# Patient Record
Sex: Male | Born: 1975 | Race: Black or African American | Hispanic: No | Smoking: Former smoker
Health system: Southern US, Community
[De-identification: ages and names within clinical notes are randomized; demographics above are authoritative.]

## PROBLEM LIST (undated history)

## (undated) DIAGNOSIS — L509 Urticaria, unspecified: Secondary | ICD-10-CM

## (undated) HISTORY — PX: HAND SURGERY: SHX662

## (undated) HISTORY — PX: APPENDECTOMY: SHX54

## (undated) HISTORY — DX: Urticaria, unspecified: L50.9

---

## 2002-10-29 ENCOUNTER — Emergency Department (HOSPITAL_COMMUNITY): Admission: EM | Admit: 2002-10-29 | Discharge: 2002-10-29 | Payer: Self-pay | Admitting: Emergency Medicine

## 2006-06-29 ENCOUNTER — Inpatient Hospital Stay (HOSPITAL_COMMUNITY): Admission: EM | Admit: 2006-06-29 | Discharge: 2006-06-30 | Payer: Self-pay | Admitting: Emergency Medicine

## 2006-06-29 ENCOUNTER — Encounter (INDEPENDENT_AMBULATORY_CARE_PROVIDER_SITE_OTHER): Payer: Self-pay | Admitting: Specialist

## 2009-01-18 ENCOUNTER — Ambulatory Visit: Admission: RE | Admit: 2009-01-18 | Discharge: 2009-01-18 | Payer: Self-pay | Admitting: Orthopedic Surgery

## 2010-10-02 LAB — CBC
HCT: 39.1 % (ref 39.0–52.0)
MCV: 100.8 fL — ABNORMAL HIGH (ref 78.0–100.0)
RBC: 3.88 MIL/uL — ABNORMAL LOW (ref 4.22–5.81)
WBC: 9.4 10*3/uL (ref 4.0–10.5)

## 2010-11-08 NOTE — Op Note (Signed)
NAME:  William Graves, William Graves NO.:  [id number]   MEDICAL RECORD NO.:  [id number]          PATIENT TYPE:  AMB   LOCATION:  DFTL                         FACILITY:  [facility]   PHYSICIAN:  Madelynn Done, MD  DATE OF BIRTH:  1976-01-25   DATE OF PROCEDURE:  01/18/2009  DATE OF DISCHARGE:                               OPERATIVE REPORT   PREOPERATIVE DIAGNOSIS:  Right small finger reverse Bennett metacarpal  base fracture with joint incongruity.   POSTOPERATIVE DIAGNOSIS:  Right small finger reverse Bennett metacarpal  base fracture with joint incongruity.   ATTENDING PHYSICIAN:  Madelynn Done, MD, who scrubbed and was  present for the entire procedure.   ASSISTANT SURGEON:  None.   SURGICAL PROCEDURES:  1. Closed manipulation and percutaneous skeletal fixation of unstable      metacarpal base fracture, reverse Bennett fracture with internal      fixation.  2. Radiographs three views, right hand.   SURGICAL IMPLANTS:  Two 0.045 K-wires.   SURGICAL INDICATIONS:  [name] is a 35 year old right-hand-dominant  gentleman, who punched stationary object last week.  The patient  presented to the office with an objective evidence of joint subluxation  of the fifth [facility] joint as well as the fracture of the base of the  metacarpal.  It was recommended that he undergo the above procedure.  Risks, benefits, and alternatives were discussed in detail with the  patient and a signed informed consent was obtained.  Risks include but  not limited to bleeding; infection; damage to nearby nerves, arteries,  or tendons; nonunion; malunion; hardware failure; loss of motion in  wrists and digits; and need for further surgical intervention.   DESCRIPTION OF PROCEDURE:  The patient was identified in preop holding  area, a mark with permanent marker was made on the right hand indicate  correct operative site.  The patient was then brought back to the  operating room, placed supine on  the anesthesia room table.  General  anesthesia was administered.  He received preoperative antibiotics.  A  well-padded tourniquet was then placed on the right brachium and sealed  with a 1000 drape.  Right upper extremity were prepped with Hibiclens  and then sterilely draped.  Time-out was called.  Correct side was  identified and procedure then begun.  Closed manipulation was then  carried out of the small finger at metacarpal base.  Radiographs using  mini C-arm confirmed reduction.  Following this, a 0.045 K-wire was then  placed in the metacarpal shaft on the small finger crossed into the  diastasis of the ring finger metacarpal with good purchase.  This held  the joint distracted or suspended.  Following this, another 0.045 K-wire  under direct mini C-arm visualization was then aided and guided from the  small finger metacarpal base into the hamate.  There is good purchase  into the bone.  Following placement of both K-wires, the joint was  assessed.  There was noted to be good alignment without any evidence of  clinical subluxation.  K-wires were then cut and bent on  left side of  the skin.  A Xeroform bolster dressing was then applied around the pin  sites.  Sterile compressive bandage were then applied.  The patient was  placed in a well molded ulnar gutter-type splint.  Tolerated procedure  well and returned to recovery room in good condition.   Intraoperative radiographs 3-views of the hand showed the internal  fixation in place with good congruity of the fifth CMC joint.   PLAN:  The patient is going to be discharged home and will be seen back  in the office in 10 days for pin check, x-rays, then placement of short-  arm cast for a total of 4 weeks immobilization.  Radiographs at each  visit.      Madelynn Done, MD  Electronically Signed     Madelynn Done, MD  Electronically Signed    FWO/MEDQ  D:  01/18/2009  T:  01/19/2009  Job:  [id number]

## 2010-11-11 NOTE — Op Note (Signed)
NAME:  William Graves, William Graves NO.:  [id number]   MEDICAL RECORD NO.:  [id number]          PATIENT TYPE:  INP   LOCATION:  2550                         FACILITY:  [facility]   PHYSICIAN:  Lebron Conners, M.D.   DATE OF BIRTH:  10-31-1975   DATE OF PROCEDURE:  06/29/2006  DATE OF DISCHARGE:                               OPERATIVE REPORT   PREOPERATIVE DIAGNOSIS:  Acute appendicitis.   POSTOPERATIVE DIAGNOSIS:  Acute appendicitis.   OPERATION:  Laparoscopic appendectomy.   SURGEON:  Lebron Conners, M.D.   ANESTHESIA:  General and local.   SPECIMEN:  Appendix.   BLOOD LOSS:  Minimal.   COMPLICATIONS:  None.   DISPOSITION:  The patient to PACU in good condition.   PROCEDURE:  After the patient was monitored and asleep and had the  bladder emptied with a catheter and had routine preparation and draping  of the abdomen, I infiltrated local anesthetic just below the umbilicus.  I made a 2 cm vertical incision and dissected down to the fascia and  made a 2 cm vertical incision in the midline and then bluntly entered  the peritoneal cavity.  I secured a Hassan cannula with 0 Vicryl  pursestring suture placing it with wide bites in the fascia.  I then  inflated the abdomen with carbon dioxide.  On first view, I could not  see any inflammation.  I placed a 5 mm right upper quadrant port and an  11 mm left lower quadrant port through anesthetized sites under direct  views noting that no viscera were injured with placement of the ports.  With the patient positioned head down and rotated to the left, I pulled  the cecum and small bowel away from the right lower quadrant and saw an  acutely inflamed appendix.  I saw no other abnormalities.  I bluntly  immobilized the appendix and grasped it with a large grasper and  elevated it.  The appendiceal mesentery was somewhat edematous and thick  and so I dissected that down just a bit with the cautery.  I then  stapled across the  mesentery and the appendix with three firings of the  endoscopic cutting stapler and then made a clean amputation of the  appendix, had good hemostasis, and no evidence of any vascular  compromise.  I irrigated the area briefly and removed a small amount of  blood which was present and a small amount of fluid which was seen.  I  checked for security of the staple lines and saw that they were secure  and that there was no continued bleeding.  I placed the appendix in a  plastic pouch and removed it through the umbilical incision and tied the  pursestring suture.  I removed the right upper quadrant  port under direct view and saw no bleeding from the abdominal wall.  After allowing carbon dioxide to escape, I removed the lower abdominal  port and then closed all skin incisions with intracuticular 4-0 Vicryl  and Steri-Strips and applied bandages.  He tolerated the operation well.      Lebron Conners, M.D.  Electronically Signed     WB/MEDQ  D:  06/29/2006  T:  06/29/2006  Job:  [id number]

## 2010-11-11 NOTE — H&P (Signed)
NAME:  William Graves, William Graves NO.:  [id number]   MEDICAL RECORD NO.:  [id number]          PATIENT TYPE:  EMS   LOCATION:  MAJO                         FACILITY:  [facility]   PHYSICIAN:  William Graves, M.D.   DATE OF BIRTH:  09-22-1975   DATE OF ADMISSION:  06/29/2006  DATE OF DISCHARGE:                              HISTORY & PHYSICAL   CHIEF COMPLAINT:  Right lower quadrant abdominal pain.   HISTORY OF PRESENT ILLNESS:  William Graves is a 35 year old male patient,  otherwise healthy, who developed right lower quadrant abdominal pain  yesterday evening.  The was the last time he ate something.  This pain  has been constant and associated with nausea and vomiting.  The pain  worsened.  His sisters convinced him he needed to present to the ER.  He  was found to have leukocytosis and a CT scan was positive for acute  appendicitis with an appendicolith.  Surgical evaluation has been  requested.   REVIEW OF SYSTEMS:  The patient's last BM was yesterday.  He has had no  fever, myalgias, or cough that he can recall.   PAST MEDICAL HISTORY:  None.   PAST SURGICAL HISTORY:  None.   FAMILY HISTORY:  Noncontributory.   SOCIAL HISTORY:  He does utilize tobacco products, 1 pack per day.  Social alcohol.  He is single and goes out 2 to 3 times per week with  friends.  He works at a call center in a desk job.   ALLERGIES:  1. ASPIRIN.  2. SULFA.   MEDICATIONS:  None.   PHYSICAL EXAMINATION:  GENERAL:  Pleasant male patient who currently  received multiple doses of Zofran and morphine for his symptoms related  to the appendicitis.  He is currently complaining of continued right  lower quadrant pain.  VITAL SIGNS:  Temp 97.2, BP 114/64, pulse 87 and regular, respirations  20.  NEURO:  Patient is alert and oriented x3, moving all extremities x4  without focal deficits.  HEENT:  Head normocephalic, sclerae noninjected.  NECK:  Supple, no adenopathy.  CHEST:  Bilateral lung  sounds clear to auscultation, respiratory effort  is nonlabored, he is on room air.  CARDIAC:  S1 S2, no rubs, murmurs, or gallops.  Pulse is regular.  ABDOMEN:  Soft, slightly distended.  Diminished bowel sounds.  He is  tender in the right lower quadrant with guarding and no rebounding.  Guarding is voluntary.  EXTREMITIES:  Symmetrical in appearance without edema, cyanosis, or  clubbing.   LABORATORY DATA:  An x-ray CT shows acute appendicitis with an  appendicolith.  White count 16,500, neutrophils 86%, hemoglobin 14.4,  platelets 182,000, sodium 138, potassium 3.7, CO2 24, glucose 121, BUN  10, creatinine 0.95.   IMPRESSION:  Acute appendicitis.   PLAN:  1. Admit the patient to the general surgical floor.  2. Agree with empiric Unasyn IV.  3. Plan a laparoscopic appendectomy this afternoon.  4. NPO status with IV fluids.  5. Dilaudid for pain and Zofran for nausea.  6. Any additional recommendations per Dr. Orson Slick after his examination  of the patient.      Allison L. Rennis Harding, N.P.      William Graves, M.D.  Electronically Signed    ALE/MEDQ  D:  06/29/2006  T:  06/29/2006  Job:  [id number]

## 2014-01-28 ENCOUNTER — Ambulatory Visit (INDEPENDENT_AMBULATORY_CARE_PROVIDER_SITE_OTHER): Payer: BC Managed Care – PPO | Admitting: Emergency Medicine

## 2014-01-28 ENCOUNTER — Ambulatory Visit (INDEPENDENT_AMBULATORY_CARE_PROVIDER_SITE_OTHER): Payer: BC Managed Care – PPO

## 2014-01-28 VITALS — BP 106/80 | HR 67 | Temp 98.2°F | Resp 16 | Ht 73.5 in | Wt 221.8 lb

## 2014-01-28 DIAGNOSIS — M545 Low back pain, unspecified: Secondary | ICD-10-CM

## 2014-01-28 MED ORDER — CYCLOBENZAPRINE HCL 10 MG PO TABS: ORAL_TABLET | ORAL | Status: DC

## 2014-01-28 NOTE — Patient Instructions (Signed)
Back Pain, Adult Low back pain is very common. About 1 in 5 people have back pain.The cause of low back pain is rarely dangerous. The pain often gets better over time.About half of people with a sudden onset of back pain feel better in just 2 weeks. About 8 in 10 people feel better by 6 weeks.  CAUSES Some common causes of back pain include:  Strain of the muscles or ligaments supporting the spine.  Wear and tear (degeneration) of the spinal discs.  Arthritis.  Direct injury to the back. DIAGNOSIS Most of the time, the direct cause of low back pain is not known.However, back pain can be treated effectively even when the exact cause of the pain is unknown.Answering your caregiver's questions about your overall health and symptoms is one of the most accurate ways to make sure the cause of your pain is not dangerous. If your caregiver needs more information, he or she may order lab work or imaging tests (X-rays or MRIs).However, even if imaging tests show changes in your back, this usually does not require surgery. HOME CARE INSTRUCTIONS For many people, back pain returns.Since low back pain is rarely dangerous, it is often a condition that people can learn to manageon their own.   Remain active. It is stressful on the back to sit or stand in one place. Do not sit, drive, or stand in one place for more than 30 minutes at a time. Take short walks on level surfaces as soon as pain allows.Try to increase the length of time you walk each day.  Do not stay in bed.Resting more than 1 or 2 days can delay your recovery.  Do not avoid exercise or work.Your body is made to move.It is not dangerous to be active, even though your back may hurt.Your back will likely heal faster if you return to being active before your pain is gone.  Pay attention to your body when you bend and lift. Many people have less discomfortwhen lifting if they bend their knees, keep the load close to their bodies,and  avoid twisting. Often, the most comfortable positions are those that put less stress on your recovering back.  Find a comfortable position to sleep. Use a firm mattress and lie on your side with your knees slightly bent. If you lie on your back, put a pillow under your knees.  Only take over-the-counter or prescription medicines as directed by your caregiver. Over-the-counter medicines to reduce pain and inflammation are often the most helpful.Your caregiver may prescribe muscle relaxant drugs.These medicines help dull your pain so you can more quickly return to your normal activities and healthy exercise.  Put ice on the injured area.  Put ice in a plastic bag.  Place a towel between your skin and the bag.  Leave the ice on for 15-20 minutes, 03-04 times a day for the first 2 to 3 days. After that, ice and heat may be alternated to reduce pain and spasms.  Ask your caregiver about trying back exercises and gentle massage. This may be of some benefit.  Avoid feeling anxious or stressed.Stress increases muscle tension and can worsen back pain.It is important to recognize when you are anxious or stressed and learn ways to manage it.Exercise is a great option. SEEK MEDICAL CARE IF:  You have pain that is not relieved with rest or medicine.  You have pain that does not improve in 1 week.  You have new symptoms.  You are generally not feeling well. SEEK   IMMEDIATE MEDICAL CARE IF:   You have pain that radiates from your back into your legs.  You develop new bowel or bladder control problems.  You have unusual weakness or numbness in your arms or legs.  You develop nausea or vomiting.  You develop abdominal pain.  You feel faint. Document Released: 06/12/2005 Document Revised: 12/12/2011 Document Reviewed: 10/14/2013 Exit[employer] Patient Information 2015 [employer]. This information is not intended to replace advice given to you by your health care provider. Make sure you  discuss any questions you have with your health care provider.  

## 2014-01-28 NOTE — Progress Notes (Signed)
   Subjective:  This chart was scribed for William ChrisSteven Markanthony Gedney, MD by Marica OtterNusrat Rahman, ED Scribe. This patient was seen in room 13 and the patient's care was started at 2:35 PM.      Patient ID: William Graves, male    DOB: 12/07/1975, 38 y.o.   MRN: [id number]  HPI HPI Comments: William Graves is a 38 y.o. male who presents to the [facility] complaining of non-radiating, worsening, intermittent middle lower back pain onset over the weekend and worsening starting Monday. Pt reports the pain was so bad today he could not work. Pt describes his pain as a tightness sensation and reports that the pain is terrible when he gets up from a sitting position. Further, pt reports the pain is aggravated with twisting and movement. Pt denies dysuria, leg pain.   Pt reports that his job requires him to do quite a bit of lifting boxes. 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    Review of Systems  Constitutional: Negative for fatigue and unexpected weight change.  Eyes: Negative for visual disturbance.  Respiratory: Negative for cough, chest tightness and shortness of breath.   Cardiovascular: Negative for chest pain, palpitations and leg swelling.  Gastrointestinal: Negative for abdominal pain and blood in stool.  Musculoskeletal: Positive for back pain (middle lower back pain).  Neurological: Negative for dizziness, light-headedness and headaches.       Objective:   Physical Exam  CONSTITUTIONAL: Well developed/well nourished HEAD: Normocephalic/atraumatic EYES: EOMI/PERRL ENMT: Mucous membranes moist NECK: supple no meningeal signs SPINE:entire spine nontender CV: S1/S2 noted, no murmurs/rubs/gallops noted LUNGS: Lungs are clear to auscultation bilaterally, no apparent distress ABDOMEN: soft, nontender, no rebound or guarding GU:no cva tenderness NEURO: Pt is awake/alert, moves all extremitiesx4 EXTREMITIES: pulses normal,  full ROM SKIN: warm, color normal PSYCH: no abnormalities of mood noted BACK: Tenderness over L4 and L5, reflexes and strength are normal.  [facility] reading (PRIMARY) by  Dr.Thoms Barthelemy there is mild narrowing at L5-S1 otherwise unremarkable      Assessment & Plan:  DIAGNOSTIC STUDIES: Oxygen Saturation is 98% on RA, nl by my interpretation.    COORDINATION OF CARE: 2:38 PM-Discussed treatment plan which includes imaging with pt at bedside and pt agreed to plan  Muscle relaxant at night he will use ice to his back and given instructions regarding stretching.. patient does not know what his aspirin allergy is. He states he has taken Aleve before without difficulties so he will take this medication.

## 2014-01-30 ENCOUNTER — Telehealth: Payer: Self-pay

## 2014-01-30 NOTE — Telephone Encounter (Signed)
Awaiting forms

## 2014-01-30 NOTE — Telephone Encounter (Signed)
Pt fmla ppw dropped off in Dr. Ellis Parentsaub's box for completion in 5-7 business days

## 2014-01-30 NOTE — Telephone Encounter (Signed)
Called patient (per Dr. Cleta Albertsaub request) to clarify why exactly he needs [name] if he was only seen one time by us for this issue and he is to return to work on Monday 02/02/14. Awaiting patient return call

## 2019-05-04 ENCOUNTER — Other Ambulatory Visit: Payer: Self-pay

## 2019-05-04 ENCOUNTER — Emergency Department (HOSPITAL_COMMUNITY): Payer: 59

## 2019-05-04 ENCOUNTER — Emergency Department (HOSPITAL_COMMUNITY)
Admission: EM | Admit: 2019-05-04 | Discharge: 2019-05-04 | Disposition: A | Discharge status: Home / Self Care | Payer: 59 | Attending: Emergency Medicine | Admitting: Emergency Medicine

## 2019-05-04 ENCOUNTER — Encounter (HOSPITAL_COMMUNITY): Payer: Self-pay | Admitting: Emergency Medicine

## 2019-05-04 DIAGNOSIS — L509 Urticaria, unspecified: Secondary | ICD-10-CM | POA: Diagnosis present

## 2019-05-04 DIAGNOSIS — Y929 Unspecified place or not applicable: Secondary | ICD-10-CM | POA: Insufficient documentation

## 2019-05-04 DIAGNOSIS — S0083XA Contusion of other part of head, initial encounter: Secondary | ICD-10-CM

## 2019-05-04 DIAGNOSIS — T782XXA Anaphylactic shock, unspecified, initial encounter: Secondary | ICD-10-CM | POA: Diagnosis not present

## 2019-05-04 DIAGNOSIS — R0789 Other chest pain: Secondary | ICD-10-CM

## 2019-05-04 DIAGNOSIS — R55 Syncope and collapse: Secondary | ICD-10-CM | POA: Insufficient documentation

## 2019-05-04 DIAGNOSIS — R0781 Pleurodynia: Secondary | ICD-10-CM | POA: Insufficient documentation

## 2019-05-04 DIAGNOSIS — Y999 Unspecified external cause status: Secondary | ICD-10-CM | POA: Insufficient documentation

## 2019-05-04 DIAGNOSIS — Y939 Activity, unspecified: Secondary | ICD-10-CM | POA: Diagnosis not present

## 2019-05-04 DIAGNOSIS — F17210 Nicotine dependence, cigarettes, uncomplicated: Secondary | ICD-10-CM | POA: Insufficient documentation

## 2019-05-04 DIAGNOSIS — W010XXA Fall on same level from slipping, tripping and stumbling without subsequent striking against object, initial encounter: Secondary | ICD-10-CM | POA: Insufficient documentation

## 2019-05-04 DIAGNOSIS — Z79899 Other long term (current) drug therapy: Secondary | ICD-10-CM | POA: Diagnosis not present

## 2019-05-04 MED ORDER — EPINEPHRINE 0.3 MG/0.3ML IJ SOAJ: 0.3000 mg | Freq: Once | INTRAMUSCULAR | 0 refills | Status: AC | PRN

## 2019-05-04 MED ORDER — CETIRIZINE HCL 10 MG PO TABS: 10.0000 mg | ORAL_TABLET | Freq: Two times a day (BID) | ORAL | 1 refills | Status: DC

## 2019-05-04 MED ORDER — PREDNISONE 20 MG PO TABS: 40.0000 mg | ORAL_TABLET | Freq: Every day | ORAL | 0 refills | Status: DC

## 2019-05-04 MED ORDER — FAMOTIDINE 20 MG PO TABS: 20.0000 mg | ORAL_TABLET | Freq: Two times a day (BID) | ORAL | 0 refills | Status: DC

## 2019-05-04 MED ORDER — METHYLPREDNISOLONE SODIUM SUCC 125 MG IJ SOLR
125.0000 mg | Freq: Once | INTRAMUSCULAR | Status: AC
  Administered 2019-05-04: 03:00:00 125 mg via INTRAVENOUS
  Filled 2019-05-04: qty 2

## 2019-05-04 MED ORDER — FAMOTIDINE IN NACL 20-0.9 MG/50ML-% IV SOLN
20.0000 mg | Freq: Once | INTRAVENOUS | Status: AC
  Administered 2019-05-04: 20 mg via INTRAVENOUS
  Filled 2019-05-04: qty 50

## 2019-05-04 NOTE — ED Triage Notes (Signed)
Pt. BIB [facility] from home c/o allergic reactions to fish followed by syncopal episode.   Pt. Ate fish at 8 pm. William Graves to sleep and woke up at 10 pm with allergy symptome, rash. Pt. Took 50 diphenhydramine PO at home and went back to sleep. Pt. Awoke again feeling worse at 1:30 am. Pt. Had syncopal episode at this time. Pt presents with hematoma to the right eye and pain on left ribs. No crepitous noted. Pt. Side is red and irritated.   EMS Initial BP 80/58 bolus given.   Medication given by EMS  0.3 epi 50 diphenylamine  4 Zofran  500 cc fluid  EMS VS 132/88  84 NSR 18 RR 97% RA

## 2019-05-04 NOTE — ED Notes (Signed)
Patient transported to X-ray 

## 2019-05-04 NOTE — Discharge Instructions (Addendum)
1. Medications: Prednisone, Zyrtec, Pepcid, EpiPen, usual home medications 2. Treatment: rest, drink plenty of fluids, take medications as prescribed 3. Follow Up: Please followup with your primary doctor in 3 days for discussion of your diagnoses and further evaluation after today's visit; if you do not have a primary care doctor use the resource guide provided to find one; followup with dermatology as needed; Return to the ER for difficulty breathing, return of allergic reaction or other concerning symptoms  

## 2019-05-04 NOTE — ED Provider Notes (Signed)
[facility] EMERGENCY DEPARTMENT Provider Note   CSN: [id number] Arrival date & time: 05/04/19  0240     History   Chief Complaint Chief Complaint  Patient presents with   Allergic Reaction   Loss of Consciousness    HPI William Graves is a 43 y.o. male with no major medical problems presents to the Emergency Department complaining of gradual, persistent, progressively worsening allergic reaction onset around 10 PM.  Patient reports he ate dinner around 8 PM which was fried fish.  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  He reports waking from sleep around 10 PM with hives over his entire body.  He got up to take Benadryl and had a witnessed syncopal episode striking his left ribs and right side of his face.  Patient took Benadryl and went back to bed.  He awoke approximately 1 hour prior to arrival with significant worsening of symptoms and EMS was contacted.  They found patient hypotensive with systolic blood pressure in the 80s, full body hives and nausea.  Patient was given 0.3 mg of epi, 50 mg of Benadryl IV and Zofran.  No wheezing or respiratory distress was noted with EMS.  Patient denies history of hospitalization, intubation or need for EpiPen and previous allergic reactions.  No other associated symptoms.  Patient reports he is beginning to feel some better after the medications.    The history is provided by the patient and medical records. No language interpreter was used.    History reviewed. No pertinent past medical history.  There are no active problems to display for this patient.   Past Surgical History:  Procedure Laterality Date   APPENDECTOMY     HAND SURGERY          Home Medications    Prior to Admission medications   Medication Sig Start Date End Date Taking? Authorizing Provider  cetirizine (ZYRTEC ALLERGY) 10 MG tablet Take 1 tablet (10 mg total) by mouth 2 (two)  times daily. 05/04/19   Yarnell Kozloski, Dahlia Client, PA-C  cyclobenzaprine (FLEXERIL) 10 MG tablet Take one tablet at night as a muscle relaxant 01/28/14   Collene Gobble, MD  EPINEPHrine (EPIPEN 2-PAK) 0.3 mg/0.3 mL IJ SOAJ injection Inject 0.3 mLs (0.3 mg total) into the muscle once as needed (for severe allergic reaction). CAll 911 immediately if you have to use this medicine 05/04/19   Rorey Bisson, Dahlia Client, PA-C  famotidine (PEPCID) 20 MG tablet Take 1 tablet (20 mg total) by mouth 2 (two) times daily for 5 days. 05/04/19 05/09/19  Ivah Girardot, Dahlia Client, PA-C  ibuprofen (ADVIL,MOTRIN) 200 MG tablet Take 400 mg by mouth every 6 (six) hours as needed.    [provider]  predniSONE (DELTASONE) 20 MG tablet Take 2 tablets (40 mg total) by mouth daily. 05/04/19   Chelsea Nusz, Boyd Kerbs    Family History History reviewed. No pertinent family history.  Social History Social History   Tobacco Use   Smoking status: Current Every Day Smoker   Smokeless tobacco: Former Engineer, water Use Topics   Alcohol use: No   Drug use: No     Allergies   Aspirin and Sulfa antibiotics   Review of Systems Review of Systems  Constitutional: Negative for appetite change, diaphoresis, fatigue, fever and unexpected weight change.  HENT: Positive for facial swelling. Negative for mouth sores.   Eyes: Negative for visual disturbance.  Respiratory: Negative for cough, chest tightness, shortness  of breath and wheezing.   Cardiovascular: Positive for syncope. Negative for chest pain.  Gastrointestinal: Positive for nausea. Negative for abdominal pain, constipation, diarrhea and vomiting.  Endocrine: Negative for polydipsia, polyphagia and polyuria.  Genitourinary: Negative for dysuria, frequency, hematuria and urgency.  Musculoskeletal: Negative for back pain and neck stiffness.  Skin: Positive for rash.  Allergic/Immunologic: Negative for immunocompromised state.  Neurological: Positive for syncope.  Negative for light-headedness and headaches.  Hematological: Does not bruise/bleed easily.  Psychiatric/Behavioral: Negative for sleep disturbance. The patient is not nervous/anxious.      Physical Exam Updated Vital Signs BP 122/86 (BP Location: Right Arm)    Pulse 87    Temp (!) 97.3 F (36.3 C) (Oral)    Resp 17    Ht 6\' 2"  (1.88 m)    Wt 102.1 kg    SpO2 99%    BMI 28.89 kg/m   Physical Exam Vitals signs and nursing note reviewed.  Constitutional:      General: He is not in acute distress.    Appearance: He is well-developed. He is not diaphoretic.  HENT:     Head: Normocephalic.      Comments: No Malocclusion No battle signs No [place] eyes No hemotympanum bilaterally     Right Ear: Tympanic membrane, ear canal and external ear normal.     Left Ear: Tympanic membrane, ear canal and external ear normal.     Nose: Nose normal. No mucosal edema or rhinorrhea.     Mouth/Throat:     Pharynx: Uvula midline. No oropharyngeal exudate, posterior oropharyngeal erythema or uvula swelling.     Tonsils: No tonsillar abscesses.  Eyes:     Extraocular Movements: Extraocular movements intact.     Conjunctiva/sclera: Conjunctivae normal.     Pupils: Pupils are equal, round, and reactive to light.  Neck:     Musculoskeletal: Normal range of motion. No pain with movement, spinous process tenderness or muscular tenderness.     Comments: Patent airway No stridor; normal phonation Handling secretions without difficulty Cardiovascular:     Rate and Rhythm: Normal rate.     Heart sounds: Normal heart sounds. No murmur.  Pulmonary:     Effort: Pulmonary effort is normal. No respiratory distress.     Breath sounds: Normal breath sounds. No stridor. No wheezing.  Abdominal:     General: Bowel sounds are normal.     Palpations: Abdomen is soft.     Tenderness: There is no abdominal tenderness.  Musculoskeletal: Normal range of motion.  Skin:    General: Skin is warm and dry.      Findings: Rash present.     Comments: Widespread urticaria noted Mild excoriations - no induration or fluctuance to indicate secondary infection  Neurological:     Mental Status: He is alert and oriented to person, place, and time.      ED Treatments / Results   Radiology Dg Ribs Unilateral W/chest Left  Result Date: 05/04/2019 CLINICAL DATA:  Syncope with fall and left rib pain. EXAM: LEFT RIBS AND CHEST - 3+ VIEW COMPARISON:  None. FINDINGS: Lungs are adequately inflated and otherwise clear. Cardiomediastinal silhouette is normal. No evidence of left rib fracture. IMPRESSION: No acute findings. Electronically Signed   By: Marin Olp M.D.   On: 05/04/2019 05:22   Ct Head Wo Contrast  Result Date: 05/04/2019 CLINICAL DATA:  Syncopal episode with trauma to right eye. EXAM: CT HEAD WITHOUT CONTRAST CT MAXILLOFACIAL WITHOUT CONTRAST TECHNIQUE: Multidetector CT imaging of  the head and maxillofacial structures were performed using the standard protocol without intravenous contrast. Multiplanar CT image reconstructions of the maxillofacial structures were also generated. COMPARISON:  None. FINDINGS: CT HEAD FINDINGS Brain: No evidence of acute infarction, hemorrhage, hydrocephalus, extra-axial collection or mass lesion/mass effect. Vascular: No hyperdense vessel or unexpected calcification. Skull: Normal. Negative for fracture or focal lesion. Other: None. CT MAXILLOFACIAL FINDINGS Osseous: No acute facial bone fracture. Orbits: Normal. Sinuses: Paranasal sinuses are well developed and well aerated without air-fluid levels. Subtle opacification over the ethmoid air cells and frontoethmoidal recess. Ostiomeatal complexes are patent. Subtle deviation of the nasal septum to the right. Mastoid air cells are clear. Soft tissues: Normal. IMPRESSION: 1.  Normal head CT. 2.  No acute facial bone fracture. Electronically Signed   By: Elberta Fortisaniel  Boyle M.D.   On: 05/04/2019 05:30   Ct Maxillofacial Wo  Contrast  Result Date: 05/04/2019 CLINICAL DATA:  Syncopal episode with trauma to right eye. EXAM: CT HEAD WITHOUT CONTRAST CT MAXILLOFACIAL WITHOUT CONTRAST TECHNIQUE: Multidetector CT imaging of the head and maxillofacial structures were performed using the standard protocol without intravenous contrast. Multiplanar CT image reconstructions of the maxillofacial structures were also generated. COMPARISON:  None. FINDINGS: CT HEAD FINDINGS Brain: No evidence of acute infarction, hemorrhage, hydrocephalus, extra-axial collection or mass lesion/mass effect. Vascular: No hyperdense vessel or unexpected calcification. Skull: Normal. Negative for fracture or focal lesion. Other: None. CT MAXILLOFACIAL FINDINGS Osseous: No acute facial bone fracture. Orbits: Normal. Sinuses: Paranasal sinuses are well developed and well aerated without air-fluid levels. Subtle opacification over the ethmoid air cells and frontoethmoidal recess. Ostiomeatal complexes are patent. Subtle deviation of the nasal septum to the right. Mastoid air cells are clear. Soft tissues: Normal. IMPRESSION: 1.  Normal head CT. 2.  No acute facial bone fracture. Electronically Signed   By: Elberta Fortisaniel  Boyle M.D.   On: 05/04/2019 05:30    Procedures .Critical Care Performed by: Dierdre ForthMuthersbaugh, Haidar Muse, PA-C Authorized by: Dierdre ForthMuthersbaugh, Aidon Klemens, PA-C   Critical care provider statement:    Critical care time (minutes):  45   Critical care time was exclusive of:  Separately billable procedures and treating other patients and teaching time   Critical care was necessary to treat or prevent imminent or life-threatening deterioration of the following conditions:  Shock   Critical care was time spent personally by me on the following activities:  Discussions with consultants, evaluation of patient's response to treatment, examination of patient, ordering and performing treatments and interventions, ordering and review of laboratory studies, ordering and review  of radiographic studies, pulse oximetry, re-evaluation of patient's condition, obtaining history from patient or surrogate and review of old charts   I assumed direction of critical care for this patient from another provider in my specialty: no     (including critical care time)  Medications Ordered in ED Medications  methylPREDNISolone sodium succinate (SOLU-MEDROL) 125 mg/2 mL injection 125 mg (125 mg Intravenous Given 05/04/19 0319)  famotidine (PEPCID) IVPB 20 mg premix (0 mg Intravenous Stopped 05/04/19 0401)     Initial Impression / Assessment and Plan / ED Course  I have reviewed the triage vital signs and the nursing notes.  Pertinent labs & imaging results that were available during my care of the patient were reviewed by me and considered in my medical decision making (see chart for details).  Clinical Course as of May 03 653  Wynelle LinkSun May 04, 2019  0315 Rash improving.  No hypotension   [HM]  0355 Improving rash,  pt without respiratory distress nausea or vomiting.    [HM]  0456 Pt feeling better. Rash continues to improve.  No SOB, wheezing, abd pain, N/V.   [HM]  0600 Patient continues to feel well.  No return of symptoms.   [HM]    Clinical Course User Index [HM] Tradarius Reinwald, Dahlia Client, PA-C       Patient presents with anaphylaxis from fish.  Hypotensive on EMS arrival after syncopal episode and received epi and Benadryl IV.  Given Solu-Medrol and Pepcid here in the emergency department.  Observed for 4 hours.  No recurrence of symptoms requiring additional epinephrine.  Patient is well-appearing without respiratory distress, nausea or vomiting.  Additionally, patient with facial contusion and rib pain secondary to fall from syncopal episode.  CT scan of the face without orbital fracture.  No cervical pain.  X-rays without evidence of rib fractures.  Patient re-evaluated prior to dc, is hemodynamically stable, in no respiratory distress, and denies the feeling of throat  closing. Pt has been advised to return to the ED if they have a mod-severe allergic rxn (s/s including throat closing, difficulty breathing, swelling of lips face or tongue). Pt is to follow up with their Asthma/allergy. Pt is agreeable with plan & verbalizes understanding.   Final Clinical Impressions(s) / ED Diagnoses   Final diagnoses:  Anaphylaxis, initial encounter  Contusion of face, initial encounter  Rib pain    ED Discharge Orders         Ordered    cetirizine (ZYRTEC ALLERGY) 10 MG tablet  2 times daily     05/04/19 0648    famotidine (PEPCID) 20 MG tablet  2 times daily     05/04/19 0648    predniSONE (DELTASONE) 20 MG tablet  Daily     05/04/19 0648    EPINEPHrine (EPIPEN 2-PAK) 0.3 mg/0.3 mL IJ SOAJ injection  Once PRN     05/04/19 0648           Nekoda Chock, Dahlia Client, PA-C 05/04/19 0654    Cardama, Amadeo Garnet, MD 05/04/19 [phone]

## 2019-05-13 ENCOUNTER — Other Ambulatory Visit: Payer: Self-pay

## 2019-05-13 ENCOUNTER — Encounter: Payer: Self-pay | Admitting: Allergy and Immunology

## 2019-05-13 ENCOUNTER — Ambulatory Visit (INDEPENDENT_AMBULATORY_CARE_PROVIDER_SITE_OTHER): Payer: 59 | Admitting: Allergy and Immunology

## 2019-05-13 VITALS — BP 122/84 | HR 88 | Temp 98.1°F | Resp 18 | Ht 74.84 in | Wt 234.5 lb

## 2019-05-13 DIAGNOSIS — H1010 Acute atopic conjunctivitis, unspecified eye: Secondary | ICD-10-CM | POA: Insufficient documentation

## 2019-05-13 DIAGNOSIS — T7800XA Anaphylactic reaction due to unspecified food, initial encounter: Secondary | ICD-10-CM | POA: Diagnosis not present

## 2019-05-13 DIAGNOSIS — J3089 Other allergic rhinitis: Secondary | ICD-10-CM | POA: Insufficient documentation

## 2019-05-13 DIAGNOSIS — H1013 Acute atopic conjunctivitis, bilateral: Secondary | ICD-10-CM | POA: Diagnosis not present

## 2019-05-13 DIAGNOSIS — L500 Allergic urticaria: Secondary | ICD-10-CM | POA: Diagnosis not present

## 2019-05-13 MED ORDER — LEVOCETIRIZINE DIHYDROCHLORIDE 5 MG PO TABS: 5.0000 mg | ORAL_TABLET | Freq: Every day | ORAL | 5 refills | Status: DC | PRN

## 2019-05-13 MED ORDER — FLUTICASONE PROPIONATE 50 MCG/ACT NA SUSP: 2.0000 | Freq: Every day | NASAL | 5 refills | Status: DC | PRN

## 2019-05-13 MED ORDER — OLOPATADINE HCL 0.2 % OP SOLN: 1.0000 [drp] | Freq: Every day | OPHTHALMIC | 5 refills | Status: DC | PRN

## 2019-05-13 NOTE — Patient Instructions (Addendum)
Allergy with anaphylaxis due to food The patient's history suggests food allergy.  Food allergen skin tests today revealed borderline positive reactivity to shrimp and lobster.  However, the histamine control revealed borderline reactivity as well.  For certain individuals, there is a refractory period in the weeks following an anaphylactic reaction, and this may have impacted the reactivity.  To be thorough, we will seek confirmation with lab work.  A laboratory order form has been provided for serum tryptase as well as serum specific IgE against shellfish panel, fish panel, and alpha gal panel.  For now, continue meticulous avoidance of shellfish and fish as discussed.  Continue to have access to epinephrine autoinjector 2 pack.  A food allergy action plan has been provided and discussed.  When lab results have returned you will be called with further recommendations.  With the newly implemented Cures Act, the labs may be visible to you at the same time they become visible to [place].  However, the results will typically not be addressed until all of the results are back, so please be patient.   Until you have heard from [place], please continue the treatment plan as outlined on your take home sheet.  Seasonal allergic rhinitis  Aeroallergen avoidance measures have been discussed and provided in written form.  A prescription has been provided for levocetirizine(Xyzal), 5 mg daily as needed.  A prescription has been provided for fluticasone nasal spray, 2 sprays per nostril daily as needed. Proper nasal spray technique has been discussed and demonstrated.  Nasal saline spray (i.e. Simply Saline) is recommended prior to medicated nasal sprays and as needed.  Allergic conjunctivitis  Treatment plan as outlined above for allergic rhinitis.  A prescription has been provided for Pataday, one drop per eye daily as needed.  I have also recommended eye lubricant drops (i.e., Natural Tears) as  needed.   Reducing Pollen Exposure  The American Academy of Allergy, Asthma and Immunology suggests the following steps to reduce your exposure to pollen during allergy seasons.    1. Do not hang sheets or clothing out to dry; pollen may collect on these items. 2. Do not mow lawns or spend time around freshly cut grass; mowing stirs up pollen. 3. Keep windows closed at night.  Keep car windows closed while driving. 4. Minimize morning activities outdoors, a time when pollen counts are usually at their highest. 5. Stay indoors as much as possible when pollen counts or humidity is high and on windy days when pollen tends to remain in the air longer. 6. Use air conditioning when possible.  Many air conditioners have filters that trap the pollen spores. 7. Use a HEPA room air filter to remove pollen form the indoor air you breathe.

## 2019-05-13 NOTE — Assessment & Plan Note (Signed)
   Aeroallergen avoidance measures have been discussed and provided in written form.  A prescription has been provided for levocetirizine(Xyzal), 5 mg daily as needed.  A prescription has been provided for fluticasone nasal spray, 2 sprays per nostril daily as needed. Proper nasal spray technique has been discussed and demonstrated.  Nasal saline spray (i.e. Simply Saline) is recommended prior to medicated nasal sprays and as needed. 

## 2019-05-13 NOTE — Assessment & Plan Note (Signed)
   Treatment plan as outlined above for allergic rhinitis.  A prescription has been provided for Pataday, one drop per eye daily as needed.  I have also recommended eye lubricant drops (i.e., Natural Tears) as needed. 

## 2019-05-13 NOTE — Assessment & Plan Note (Signed)
The patient's history suggests food allergy.  Food allergen skin tests today revealed borderline positive reactivity to shrimp and lobster.  However, the histamine control revealed borderline reactivity as well.  For certain individuals, there is a refractory period in the weeks following an anaphylactic reaction, and this may have impacted the reactivity.  To be thorough, we will seek confirmation with lab work.  A laboratory order form has been provided for serum tryptase as well as serum specific IgE against shellfish panel, fish panel, and alpha gal panel.  For now, continue meticulous avoidance of shellfish and fish as discussed.  Continue to have access to epinephrine autoinjector 2 pack.  A food allergy action plan has been provided and discussed.  When lab results have returned you will be called with further recommendations.  With the newly implemented Cures Act, the labs may be visible to you at the same time they become visible to [place].  However, the results will typically not be addressed until all of the results are back, so please be patient.   Until you have heard from [place], please continue the treatment plan as outlined on your take home sheet.

## 2019-05-13 NOTE — Progress Notes (Signed)
New Patient Note  RE: William Graves MRN: [id number] DOB: 25-Sep-1975 Date of Office Visit: 05/13/2019  Referring provider: No ref. provider found Primary care provider: Patient, No Pcp Per  Chief Complaint: Allergic Reaction and Urticaria   History of present illness: William Graves is a 43 y.o. male presenting today for evaluation of allergic reaction.  He reports that on November 7 at around 10 PM he developed a hive on his right arm and on his neck.  He took diphenhydramine and went to bed but woke up "itching like crazy".  He walked to the bathroom and noticed "huge hives all over" his chest and arms.  As he walked back to the bedroom he passed out.  His girlfriend called 64 and EMS treated him with epinephrine and IV diphenhydramine and took him to the [facility] emergency department.  He was observed until the next morning and released with a prescription for epinephrine autoinjector 2 pack, cetirizine, famotidine, and prednisone.  He has not had symptoms since the initial reaction.  He discontinued all medications this past Friday.  On November 7 he had consumed bacon and eggs at around 9 or 10 AM, chicken and fries at around 2 PM, and flounder and shrimp with [place] fries around 7 PM.  He notes that last year while he was in [place] he went to the urgent care because he felt like his "throat was closing."  He was told that it was most likely due to an environmental allergy and was prescribed a nasal spray.  Approximately 7 years ago he was at [place] and had a similar episode with a sensation of throat tightness and hoarseness.  At that time his physician told him that it was most likely environmental allergies and was instructed to take loratadine/pseudoephedrine.  He does experience occasional nasal congestion, rhinorrhea, sneezing, nasal pruritus, and ocular pruritus.  The symptoms typically occur with pollen exposure and he occasionally takes over-the-counter antihistamines in an  attempt to control the symptoms.  Assessment and plan: Allergy with anaphylaxis due to food The patient's history suggests food allergy.  Food allergen skin tests today revealed borderline positive reactivity to shrimp and lobster.  However, the histamine control revealed borderline reactivity as well.  For certain individuals, there is a refractory period in the weeks following an anaphylactic reaction, and this may have impacted the reactivity.  To be thorough, we will seek confirmation with lab work.  A laboratory order form has been provided for serum tryptase as well as serum specific IgE against shellfish panel, fish panel, and alpha gal panel.  For now, continue meticulous avoidance of shellfish and fish as discussed.  Continue to have access to epinephrine autoinjector 2 pack.  A food allergy action plan has been provided and discussed.  When lab results have returned you will be called with further recommendations.  With the newly implemented Cures Act, the labs may be visible to you at the same time they become visible to [place].  However, the results will typically not be addressed until all of the results are back, so please be patient.   Until you have heard from [place], please continue the treatment plan as outlined on your take home sheet.  Seasonal allergic rhinitis  Aeroallergen avoidance measures have been discussed and provided in written form.  A prescription has been provided for levocetirizine(Xyzal), 5 mg daily as needed.  A prescription has been provided for fluticasone nasal spray, 2 sprays per nostril daily as needed. Proper  nasal spray technique has been discussed and demonstrated.  Nasal saline spray (i.e. Simply Saline) is recommended prior to medicated nasal sprays and as needed.  Allergic conjunctivitis  Treatment plan as outlined above for allergic rhinitis.  A prescription has been provided for Pataday, one drop per eye daily as needed.  I have also  recommended eye lubricant drops (i.e., Natural Tears) as needed.   Meds ordered this encounter  Medications   levocetirizine (XYZAL) 5 MG tablet    Sig: Take 1 tablet (5 mg total) by mouth daily as needed for allergies.    Dispense:  30 tablet    Refill:  5   fluticasone (FLONASE) 50 MCG/ACT nasal spray    Sig: Place 2 sprays into both nostrils daily as needed for allergies or rhinitis.    Dispense:  16 g    Refill:  5   Olopatadine HCl (PATADAY) 0.2 % SOLN    Sig: Place 1 drop into both eyes daily as needed.    Dispense:  2.5 mL    Refill:  5    Diagnostics: Environmental skin testing: Borderline reactivity to grass pollen (histamine control had borderline reactivity). Food allergen skin testing: Borderline reactivity to shrimp and lobster (histamine control had borderline reactivity).    Physical examination: Blood pressure 122/84, pulse 88, temperature 98.1 F (36.7 C), temperature source Temporal, resp. rate 18, height 6' 2.84" (1.901 m), weight 234 lb 8 oz (106.4 kg), SpO2 97 %.  General: Alert, interactive, in no acute distress. HEENT: TMs pearly gray, turbinates mildly edematous without discharge, post-pharynx mildly erythematous. Neck: Supple without lymphadenopathy. Lungs: Clear to auscultation without wheezing, rhonchi or rales. CV: Normal S1, S2 without murmurs. Abdomen: Nondistended, nontender. Skin: Warm and dry, without lesions or rashes. Extremities:  No clubbing, cyanosis or edema. Neuro:   Grossly intact.  Review of systems:  Review of systems negative except as noted in HPI / PMHx or noted below: Review of Systems  Constitutional: Negative.   HENT: Negative.   Eyes: Negative.   Respiratory: Negative.   Cardiovascular: Negative.   Gastrointestinal: Negative.   Genitourinary: Negative.   Musculoskeletal: Negative.   Skin: Negative.   Neurological: Negative.   Endo/Heme/Allergies: Negative.   Psychiatric/Behavioral: Negative.     Past medical  history:  Past Medical History:  Diagnosis Date   Urticaria     Past surgical history:  Past Surgical History:  Procedure Laterality Date   APPENDECTOMY     HAND SURGERY      Family history: Family History  Problem Relation Age of Onset   Allergic rhinitis Mother    Urticaria Mother    Asthma Sister    Eczema Sister     Social history: Social History   Socioeconomic History   Marital status: Married    Spouse name: Not on file   Number of children: Not on file   Years of education: Not on file   Highest education level: Not on file  Occupational History   Not on file  Social Needs   Financial resource strain: Not on file   Food insecurity    Worry: Not on file    Inability: Not on file   Transportation needs    Medical: Not on file    Non-medical: Not on file  Tobacco Use   Smoking status: Current Every Day Smoker   Smokeless tobacco: Former Neurosurgeon  Substance and Sexual Activity   Alcohol use: No   Drug use: No   Sexual activity: Yes  Lifestyle   Physical activity    Days per week: Not on file    Minutes per session: Not on file   Stress: Not on file  Relationships   Social connections    Talks on phone: Not on file    Gets together: Not on file    Attends religious service: Not on file    Active member of club or organization: Not on file    Attends meetings of clubs or organizations: Not on file    Relationship status: Not on file   Intimate partner violence    Fear of current or ex partner: Not on file    Emotionally abused: Not on file    Physically abused: Not on file    Forced sexual activity: Not on file  Other Topics Concern   Not on file  Social History Narrative   Not on file    Environmental History: The patient lives in a 42-year-old house with carpeting throughout, gassy, and central air.  There is no known mold/water damage in the home.  There are dogs in the home which have access to his bedroom.  He is a  nonsmoker.  Current Outpatient Medications  Medication Sig Dispense Refill   cetirizine (ZYRTEC ALLERGY) 10 MG tablet Take 1 tablet (10 mg total) by mouth 2 (two) times daily. 10 tablet 1   EPINEPHrine (EPIPEN 2-PAK) 0.3 mg/0.3 mL IJ SOAJ injection Inject 0.3 mLs (0.3 mg total) into the muscle once as needed (for severe allergic reaction). CAll 911 immediately if you have to use this medicine 2 each 0   ibuprofen (ADVIL,MOTRIN) 200 MG tablet Take 400 mg by mouth every 6 (six) hours as needed.     famotidine (PEPCID) 20 MG tablet Take 1 tablet (20 mg total) by mouth 2 (two) times daily for 5 days. 10 tablet 0   fluticasone (FLONASE) 50 MCG/ACT nasal spray Place 2 sprays into both nostrils daily as needed for allergies or rhinitis. 16 g 5   levocetirizine (XYZAL) 5 MG tablet Take 1 tablet (5 mg total) by mouth daily as needed for allergies. 30 tablet 5   Olopatadine HCl (PATADAY) 0.2 % SOLN Place 1 drop into both eyes daily as needed. 2.5 mL 5   No current facility-administered medications for this visit.     Known medication allergies: Allergies  Allergen Reactions   Aspirin    Sulfa Antibiotics     I appreciate the opportunity to take part in Trevelle's care. Please do not hesitate to contact me with questions.  Sincerely,   R. Jorene Guestarter Daiki Dicostanzo, MD

## 2019-05-23 LAB — ALLERGEN PROFILE, SHELLFISH
Clam IgE: 0.31 kU/L — AB
F023-IgE Crab: 7.46 kU/L — AB
F080-IgE Lobster: 0.84 kU/L — AB
F290-IgE Oyster: <0.1 kU/L
Scallop IgE: 0.14 kU/L — AB
Shrimp IgE: 5.71 kU/L — AB

## 2019-05-23 LAB — ALPHA-GAL PANEL
Alpha Gal IgE*: <0.1 kU/L (ref <0.10)
Beef (Bos spp) IgE: 0.71 kU/L — ABNORMAL HIGH (ref <0.35)
Class Interpretation: 0
Class Interpretation: 0
Class Interpretation: 2
Lamb/Mutton (Ovis spp) IgE: <0.1 kU/L (ref <0.35)
Pork (Sus spp) IgE: <0.1 kU/L (ref <0.35)

## 2019-05-23 LAB — ALLERGEN PROFILE, FOOD-FISH
Allergen Mackerel IgE: <0.1 kU/L
Allergen Salmon IgE: 0.24 kU/L — AB
Allergen Trout IgE: 0.12 kU/L — AB
Allergen Walley Pike IgE: <0.1 kU/L
Codfish IgE: 0.2 kU/L — AB
Halibut IgE: <0.1 kU/L
Tuna: 0.62 kU/L — AB

## 2019-05-23 LAB — TRYPTASE: Tryptase: 3.8 ug/L (ref 2.2–13.2)

## 2021-04-12 IMAGING — CT CT HEAD W/O CM
4 series · 16 of 47 positions shown, 18 images · non-contrast
Comparison: None.

CLINICAL DATA: Syncopal episode with trauma to right eye.

EXAM:
CT HEAD WITHOUT CONTRAST
CT MAXILLOFACIAL WITHOUT CONTRAST
TECHNIQUE: Multidetector CT imaging of the head and maxillofacial structures
were performed using the standard protocol without intravenous
contrast. Multiplanar CT image reconstructions of the maxillofacial
structures were also generated.

[Series 505: head wo · axial · 0.50mm/px · z∈[-101,+19]mm · 7 of 34 slices shown, 9 images]
[im 5/34  brain]
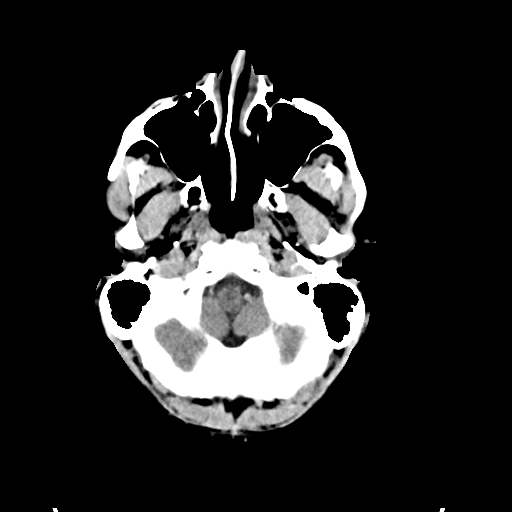
[im 5/34  bone]
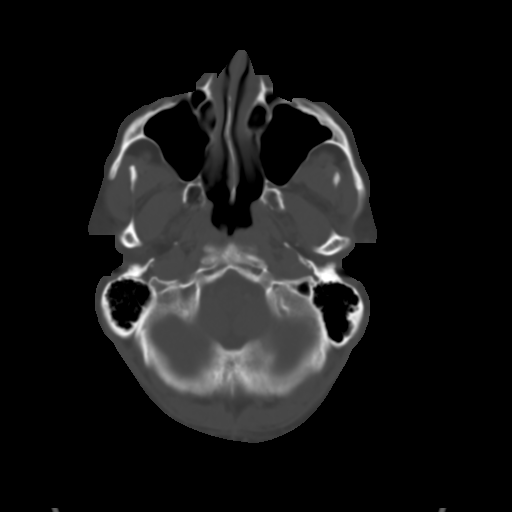
[im 9/34  brain]
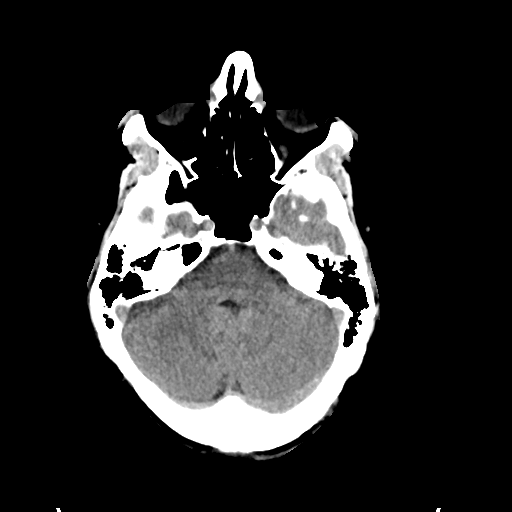
[im 13/34  brain]
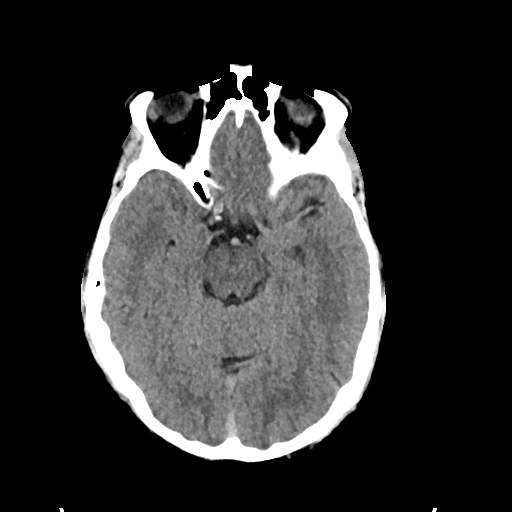
[im 17/34  brain]
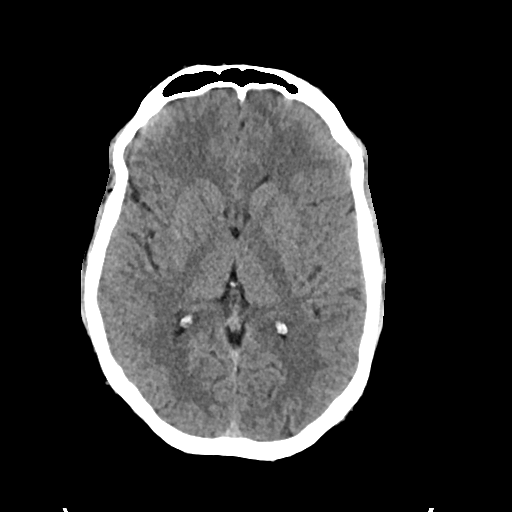
[im 21/34  brain]
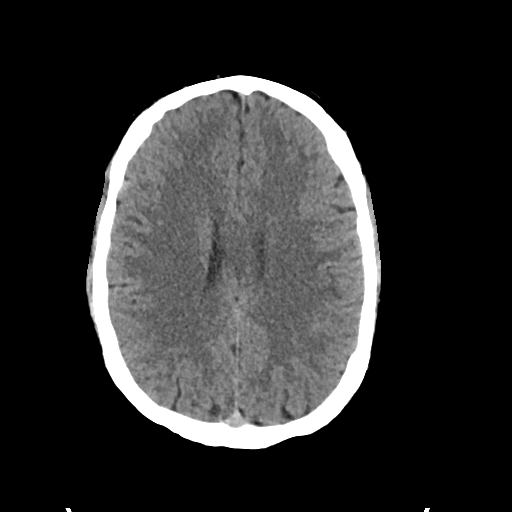
[im 21/34  bone]
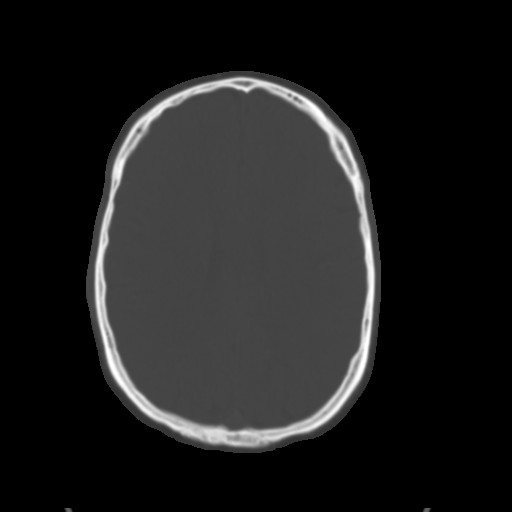
[im 25/34  brain]
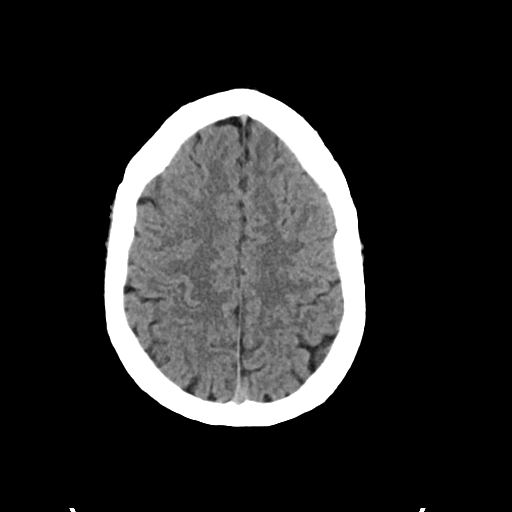
[im 29/34  brain]
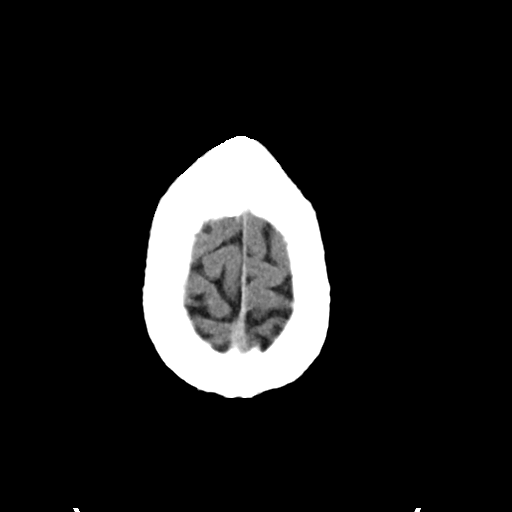

[Series 506: head bone · axial · 0.50mm/px · z∈[-105,-73]mm · 3 of 84 slices shown]
[im 9/84  bone]
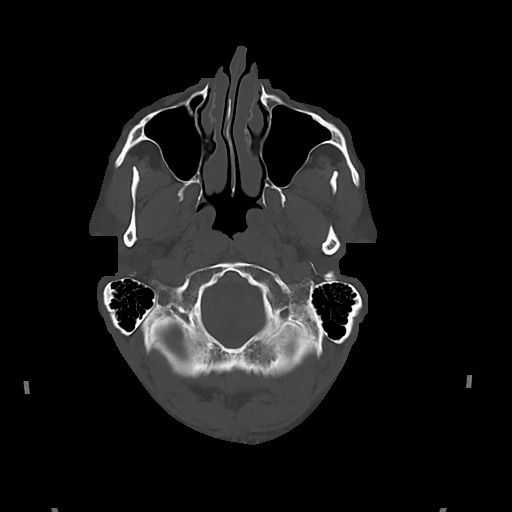
[im 17/84  bone]
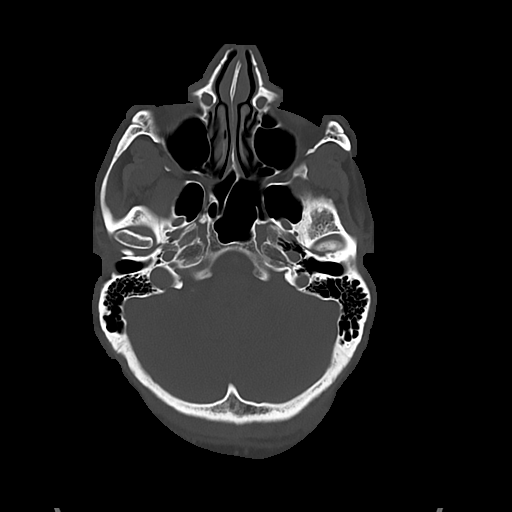
[im 25/84  bone]
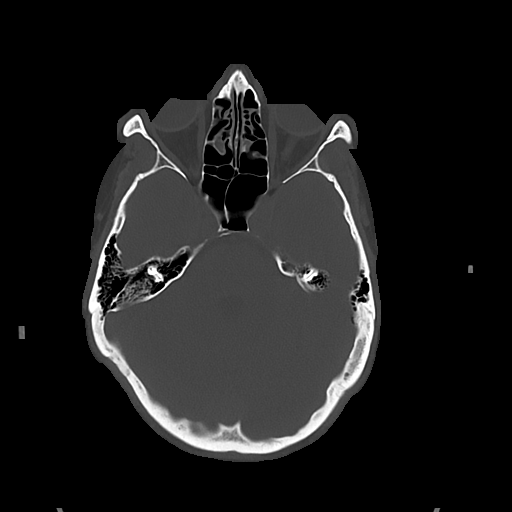

[Series 507: cor soft · coronal · 0.39mm/px · 3 of 103 slices shown]
[im 35/103  brain]
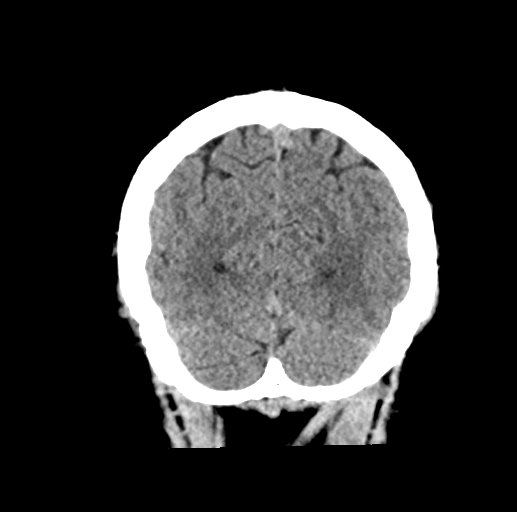
[im 46/103  brain]
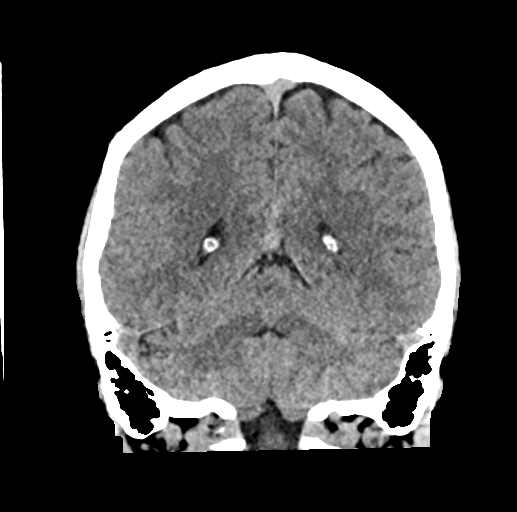
[im 57/103  brain]
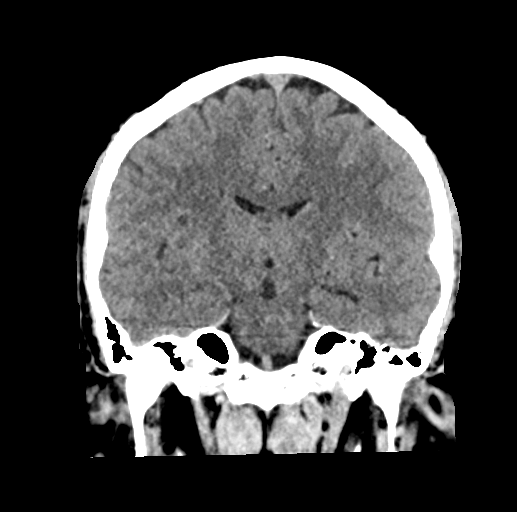

[Series 508: sag soft · sagittal · 0.38mm/px · 3 of 68 slices shown]
[im 23/68  brain]
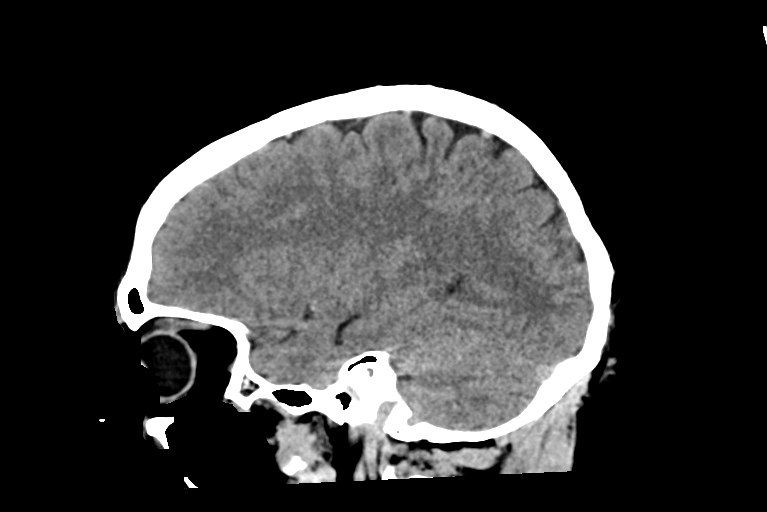
[im 34/68  brain]
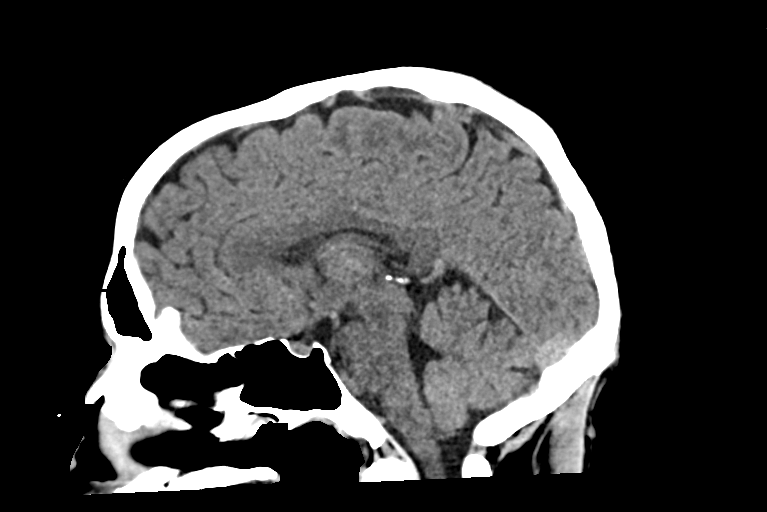
[im 45/68  brain]
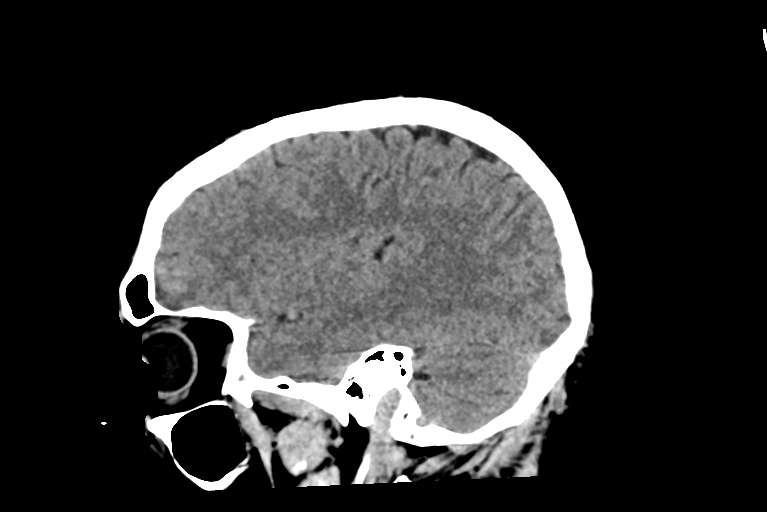

[16 of 47 positions shown; findings below may reference images not displayed]

FINDINGS: CT HEAD FINDINGS

Brain: No evidence of acute infarction, hemorrhage, hydrocephalus,
extra-axial collection or mass lesion/mass effect.

Vascular: No hyperdense vessel or unexpected calcification.

Skull: Normal. Negative for fracture or focal lesion.

Other: None.

CT MAXILLOFACIAL FINDINGS

Osseous: No acute facial bone fracture.

Orbits: Normal.

Sinuses: Paranasal sinuses are well developed and well aerated
without air-fluid levels. Subtle opacification over the ethmoid air
cells and frontoethmoidal recess. Ostiomeatal complexes are patent.
Subtle deviation of the nasal septum to the right. Mastoid air cells
are clear.

Soft tissues: Normal.
IMPRESSION: 1.  Normal head CT.

2.  No acute facial bone fracture.

## 2021-04-12 IMAGING — CT CT MAXILLOFACIAL W/O CM
3 of 8 series · 14 of 47 positions shown, 17 images · non-contrast
Comparison: None.

CLINICAL DATA: Syncopal episode with trauma to right eye.

EXAM:
CT HEAD WITHOUT CONTRAST
CT MAXILLOFACIAL WITHOUT CONTRAST
TECHNIQUE: Multidetector CT imaging of the head and maxillofacial structures
were performed using the standard protocol without intravenous
contrast. Multiplanar CT image reconstructions of the maxillofacial
structures were also generated.

[Series 8: st thins · axial · 0.36mm/px · z∈[-193,-38]mm · 9 of 278 slices shown, 12 images]
[im 28/278  brain]
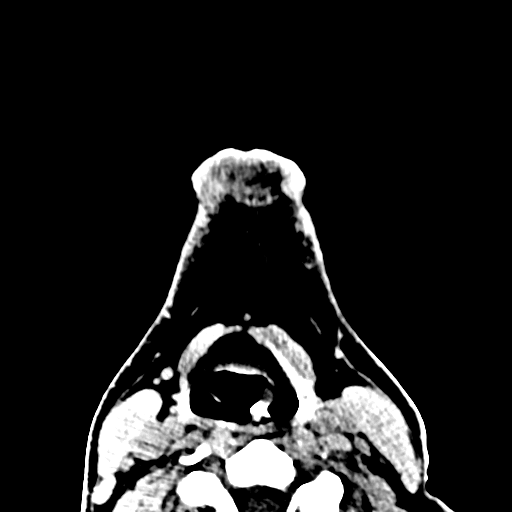
[im 28/278  bone]
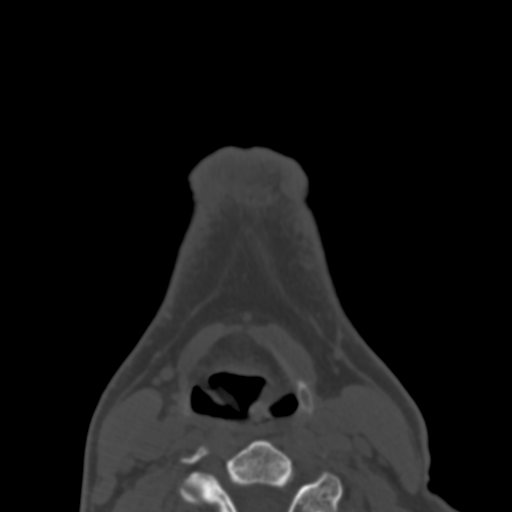
[im 56/278  bone]
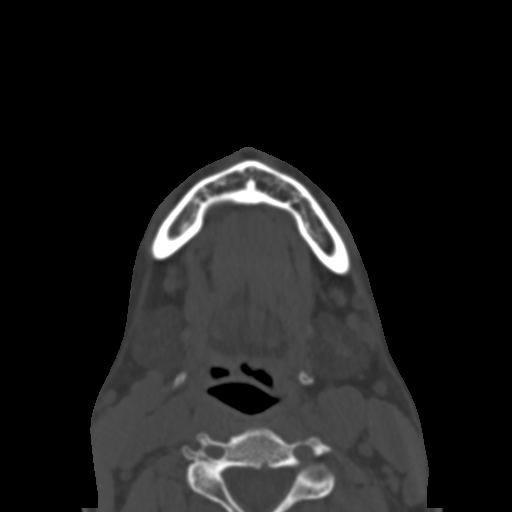
[im 84/278  bone]
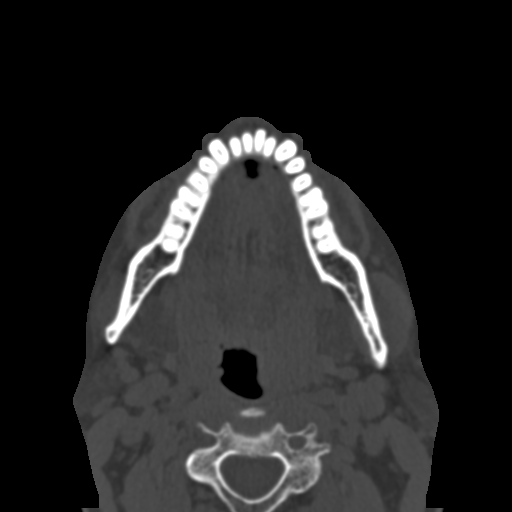
[im 111/278  bone]
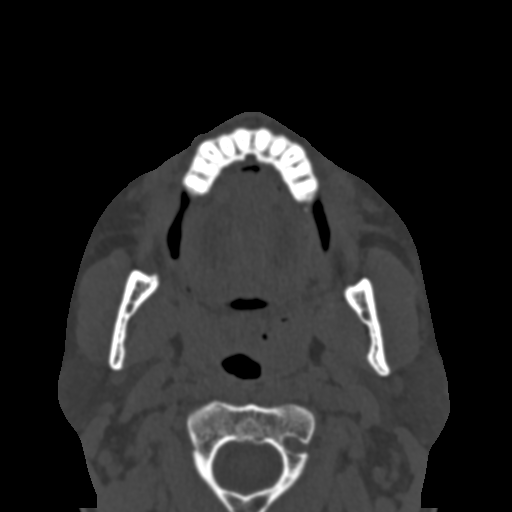
[im 139/278  brain]
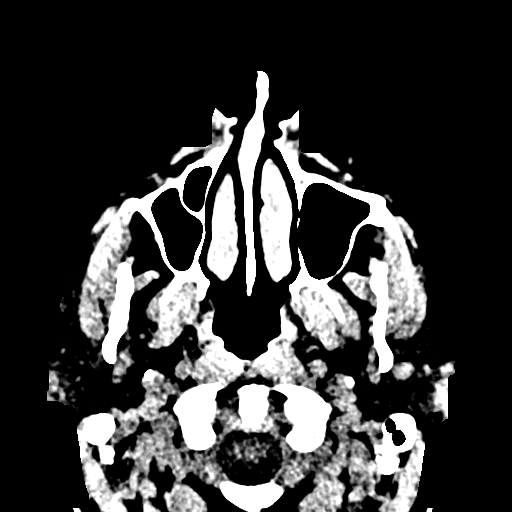
[im 139/278  bone]
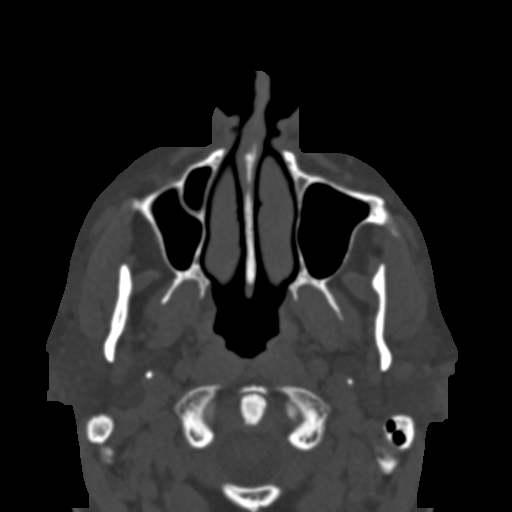
[im 167/278  bone]
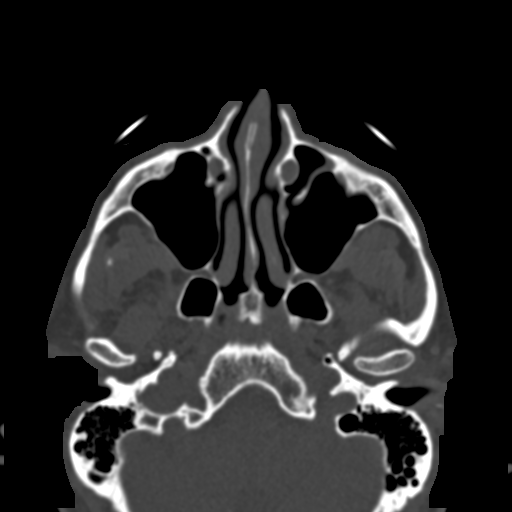
[im 194/278  bone]
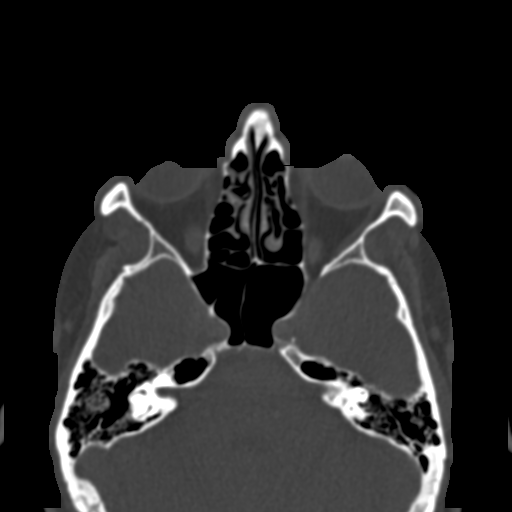
[im 222/278  bone]
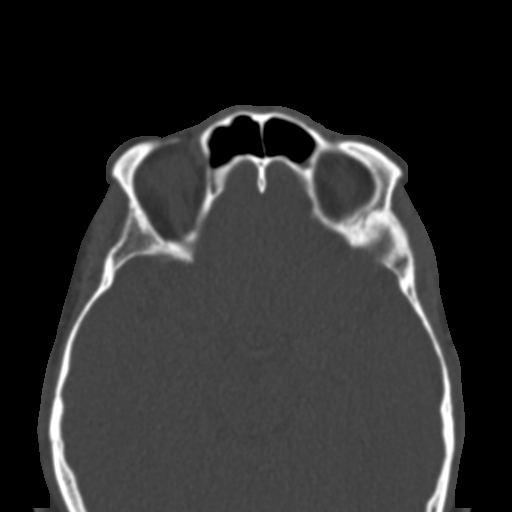
[im 250/278  brain]
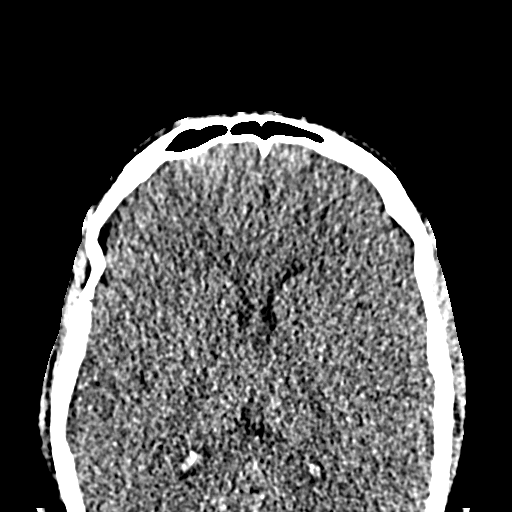
[im 250/278  bone]
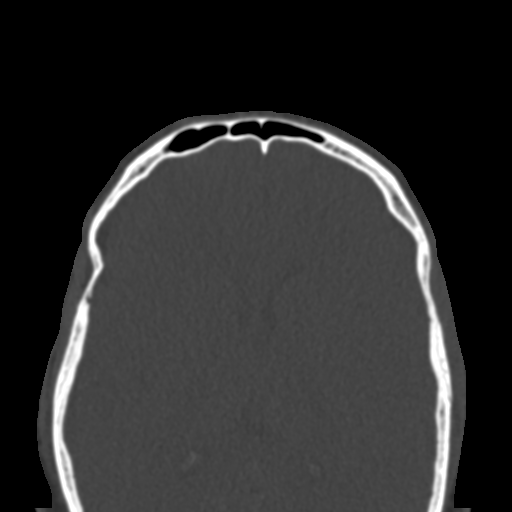

[Series 11: st cor · coronal · 0.41mm/px · 3 of 155 slices shown]
[im 31/155  bone]
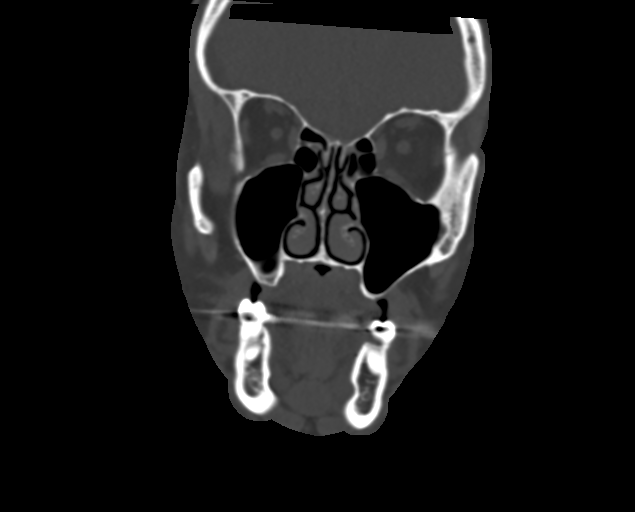
[im 62/155  bone]
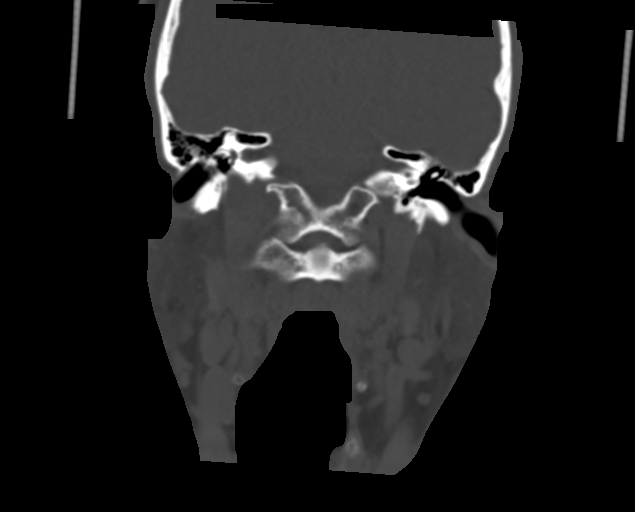
[im 93/155  bone]
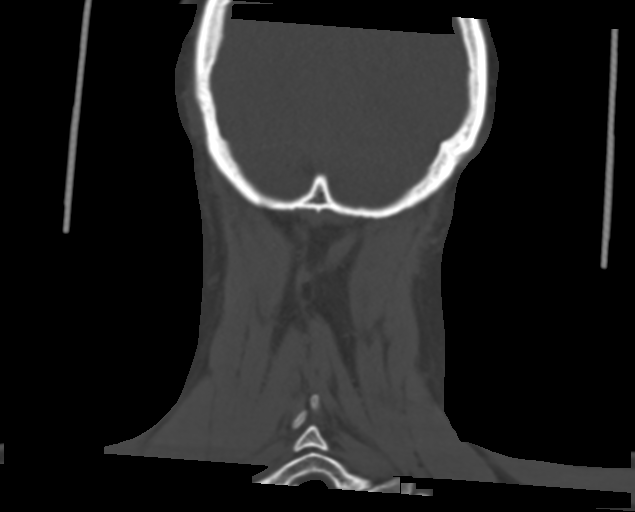

[Series 12: st sag · sagittal · 0.43mm/px · 2 of 130 slices shown]
[im 46/130  bone]
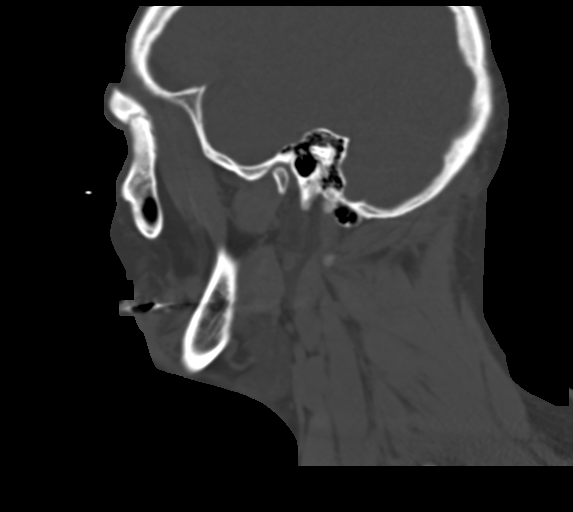
[im 88/130  bone]
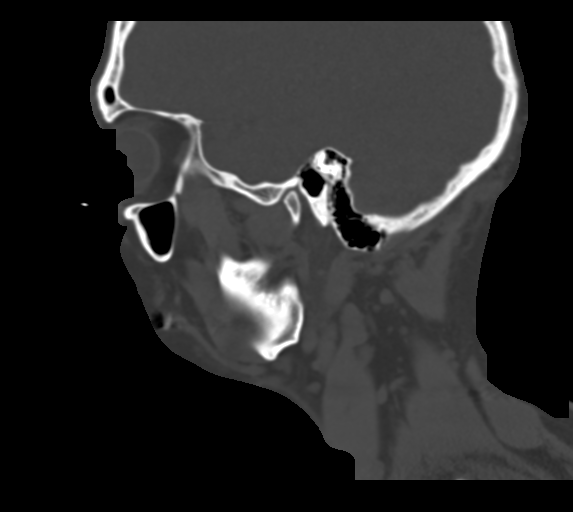

[14 of 47 positions shown; findings below may reference images not displayed]

FINDINGS: CT HEAD FINDINGS

Brain: No evidence of acute infarction, hemorrhage, hydrocephalus,
extra-axial collection or mass lesion/mass effect.

Vascular: No hyperdense vessel or unexpected calcification.

Skull: Normal. Negative for fracture or focal lesion.

Other: None.

CT MAXILLOFACIAL FINDINGS

Osseous: No acute facial bone fracture.

Orbits: Normal.

Sinuses: Paranasal sinuses are well developed and well aerated
without air-fluid levels. Subtle opacification over the ethmoid air
cells and frontoethmoidal recess. Ostiomeatal complexes are patent.
Subtle deviation of the nasal septum to the right. Mastoid air cells
are clear.

Soft tissues: Normal.
IMPRESSION: 1.  Normal head CT.

2.  No acute facial bone fracture.

## 2024-02-04 ENCOUNTER — Encounter (HOSPITAL_COMMUNITY): Payer: Self-pay | Admitting: *Deleted

## 2024-02-04 ENCOUNTER — Other Ambulatory Visit: Payer: Self-pay

## 2024-02-04 ENCOUNTER — Emergency Department (HOSPITAL_COMMUNITY)
Admission: EM | Admit: 2024-02-04 | Discharge: 2024-02-04 | Disposition: A | Discharge status: Home / Self Care | Attending: Emergency Medicine | Admitting: Emergency Medicine

## 2024-02-04 DIAGNOSIS — R195 Other fecal abnormalities: Secondary | ICD-10-CM

## 2024-02-04 DIAGNOSIS — K625 Hemorrhage of anus and rectum: Secondary | ICD-10-CM | POA: Insufficient documentation

## 2024-02-04 LAB — COMPREHENSIVE METABOLIC PANEL WITH GFR
ALT: 11 U/L (ref 0–44)
AST: 17 U/L (ref 15–41)
Albumin: 4 g/dL (ref 3.5–5.0)
Alkaline Phosphatase: 53 U/L (ref 38–126)
Anion gap: 8 (ref 5–15)
BUN: 10 mg/dL (ref 6–20)
CO2: 26 mmol/L (ref 22–32)
Calcium: 9 mg/dL (ref 8.9–10.3)
Chloride: 108 mmol/L (ref 98–111)
Creatinine, Ser: 1 mg/dL (ref 0.61–1.24)
GFR, Estimated: >60 mL/min (ref >60)
Glucose, Bld: 109 mg/dL — ABNORMAL HIGH (ref 70–99)
Potassium: 4 mmol/L (ref 3.5–5.1)
Sodium: 142 mmol/L (ref 135–145)
Total Bilirubin: 1.2 mg/dL (ref 0.0–1.2)
Total Protein: 7.3 g/dL (ref 6.5–8.1)

## 2024-02-04 LAB — CBC
HCT: 43.2 % (ref 39.0–52.0)
Hemoglobin: 13.9 g/dL (ref 13.0–17.0)
MCH: 31.3 pg (ref 26.0–34.0)
MCHC: 32.2 g/dL (ref 30.0–36.0)
MCV: 97.3 fL (ref 80.0–100.0)
Platelets: 197 K/uL (ref 150–400)
RBC: 4.44 MIL/uL (ref 4.22–5.81)
RDW: 10.6 % — ABNORMAL LOW (ref 11.5–15.5)
WBC: 6.1 K/uL (ref 4.0–10.5)
nRBC: 0 % (ref 0.0–0.2)

## 2024-02-04 LAB — TYPE AND SCREEN
ABO/RH(D): O POS
Antibody Screen: NEGATIVE

## 2024-02-04 LAB — POC OCCULT BLOOD, ED: Fecal Occult Bld: POSITIVE — AB

## 2024-02-04 NOTE — ED Triage Notes (Signed)
 C/o blood in his stool onset last pm , states it is bright red. C/o rectal pain and abd. Pain denies n/v

## 2024-02-04 NOTE — ED Provider Notes (Signed)
 [place] EMERGENCY DEPARTMENT AT [facility] Provider Note   CSN: [id number] Arrival date & time: 02/04/24  9066     Patient presents with: Blood In Stools   William Graves is a 48 y.o. male.   Pt with c/o noting blood in stool last pm. Had been straining to have bm, was bright red, small amount. No other episodes blood per rectum. No hx melena. No rectal trauma/pain. No diarrhea. No abd  distension. No constant, focal or severe abdominal pain. No fever or chills. No other abnormal bruising or bleeding. No anticoagulant use. No faintness.   The history is provided by the patient and medical records.       Prior to Admission medications   Medication Sig Start Date End Date Taking? Authorizing Provider  cetirizine  (ZYRTEC  ALLERGY ) 10 MG tablet Take 1 tablet (10 mg total) by mouth 2 (two) times daily. 05/04/19   Muthersbaugh, Chiquita, PA-C  EPINEPHrine  (EPIPEN  2-PAK) 0.3 mg/0.3 mL IJ SOAJ injection Inject 0.3 mLs (0.3 mg total) into the muscle once as needed (for severe allergic reaction). CAll 911 immediately if you have to use this medicine 05/04/19   Muthersbaugh, Chiquita, PA-C  famotidine  (PEPCID ) 20 MG tablet Take 1 tablet (20 mg total) by mouth 2 (two) times daily for 5 days. 05/04/19 05/09/19  Muthersbaugh, Chiquita, PA-C  fluticasone  (FLONASE ) 50 MCG/ACT nasal spray Place 2 sprays into both nostrils daily as needed for allergies or rhinitis. 05/13/19   Bobbitt, Elgin Pepper, MD  ibuprofen (ADVIL,MOTRIN) 200 MG tablet Take 400 mg by mouth every 6 (six) hours as needed.    [provider]  levocetirizine (XYZAL ) 5 MG tablet Take 1 tablet (5 mg total) by mouth daily as needed for allergies. 05/13/19   Bobbitt, Elgin Pepper, MD  Olopatadine  HCl (PATADAY ) 0.2 % SOLN Place 1 drop into both eyes daily as needed. 05/13/19   Bobbitt, Elgin Pepper, MD    Allergies: Aspirin and Sulfa antibiotics    Review of Systems  Constitutional:  Negative for fever.  HENT:  Negative for  nosebleeds.   Respiratory:  Negative for shortness of breath.   Cardiovascular:  Negative for chest pain.  Gastrointestinal:  Positive for blood in stool. Negative for abdominal pain, nausea and vomiting.  Genitourinary:  Negative for flank pain and hematuria.  Musculoskeletal:  Negative for back pain.  Neurological:  Negative for syncope and light-headedness.  Hematological:  Does not bruise/bleed easily.    Updated Vital Signs BP 126/87   Pulse 70   Temp 98 F (36.7 C) (Oral)   Resp 16   Ht 1.88 m (6' 2)   Wt 104.3 kg   SpO2 100%   BMI 29.53 kg/m   Physical Exam Vitals and nursing note reviewed.  Constitutional:      Appearance: Normal appearance. He is well-developed.  HENT:     Head: Atraumatic.     Nose: Nose normal.     Mouth/Throat:     Mouth: Mucous membranes are moist.  Eyes:     General: No scleral icterus.    Conjunctiva/sclera: Conjunctivae normal.  Neck:     Trachea: No tracheal deviation.  Cardiovascular:     Rate and Rhythm: Normal rate and regular rhythm.     Pulses: Normal pulses.     Heart sounds: Normal heart sounds. No murmur heard.    No friction rub. No gallop.  Pulmonary:     Effort: Pulmonary effort is normal. No accessory muscle usage or respiratory distress.  Breath sounds: Normal breath sounds.  Abdominal:     General: Bowel sounds are normal. There is no distension.     Palpations: Abdomen is soft. There is no mass.     Tenderness: There is no abdominal tenderness. There is no guarding.  Genitourinary:    Comments: Chaperoned rectal exam with pt's RN - medium brown stool, sent for hemoccult. No mass felt. No external hemorrhoids or fissure visualized.  Musculoskeletal:        General: No swelling.     Cervical back: Neck supple.  Skin:    General: Skin is warm and dry.     Findings: No rash.  Neurological:     Mental Status: He is alert.     Comments: Alert, speech clear.   Psychiatric:        Mood and Affect: Mood normal.      (all labs ordered are listed, but only abnormal results are displayed) Results for orders placed or performed during the hospital encounter of 02/04/24  Comprehensive metabolic panel   Collection Time: 02/04/24  9:53 AM  Result Value Ref Range   Sodium 142 135 - 145 mmol/L   Potassium 4.0 3.5 - 5.1 mmol/L   Chloride 108 98 - 111 mmol/L   CO2 26 22 - 32 mmol/L   Glucose, Bld 109 (H) 70 - 99 mg/dL   BUN 10 6 - 20 mg/dL   Creatinine, Ser 8.99 0.61 - 1.24 mg/dL   Calcium 9.0 8.9 - 89.6 mg/dL   Total Protein 7.3 6.5 - 8.1 g/dL   Albumin 4.0 3.5 - 5.0 g/dL   AST 17 15 - 41 U/L   ALT 11 0 - 44 U/L   Alkaline Phosphatase 53 38 - 126 U/L   Total Bilirubin 1.2 0.0 - 1.2 mg/dL   GFR, Estimated >39 >39 mL/min   Anion gap 8 5 - 15  CBC   Collection Time: 02/04/24  9:53 AM  Result Value Ref Range   WBC 6.1 4.0 - 10.5 K/uL   RBC 4.44 4.22 - 5.81 MIL/uL   Hemoglobin 13.9 13.0 - 17.0 g/dL   HCT 56.7 60.9 - 47.9 %   MCV 97.3 80.0 - 100.0 fL   MCH 31.3 26.0 - 34.0 pg   MCHC 32.2 30.0 - 36.0 g/dL   RDW 89.3 (L) 88.4 - 84.4 %   Platelets 197 150 - 400 K/uL   nRBC 0.0 0.0 - 0.2 %  Type and screen [facility]   Collection Time: 02/04/24  9:53 AM  Result Value Ref Range   ABO/RH(D) PENDING    Antibody Screen PENDING    Sample Expiration      02/07/2024,2359 Performed at [facility], [address] [place], [place] [address]   POC occult blood, ED Provider will collect   Collection Time: 02/04/24  9:54 AM  Result Value Ref Range   Fecal Occult Bld POSITIVE (A) NEGATIVE      EKG: None  Radiology: No results found.   Procedures   Medications Ordered in the ED - No data to display                                  Medical Decision Making Problems Addressed: Heme positive stool: acute illness or injury Rectal bleeding: acute illness or injury with systemic symptoms that poses a threat to life or bodily functions  Amount and/or Complexity of  Data Reviewed External Data Reviewed: notes. Labs: ordered. Decision-making details documented in ED Course.  Risk Decision regarding hospitalization.   Iv ns. Labs ordered/sent.  Differential diagnosis includes acute gi bleeding, fissure, hemorrhoid, diverticula, etc. Dispo decision including potential need for admission considered - will get labs and reassess.   Reviewed nursing notes and prior charts for additional history. External reports reviewed.  Labs reviewed/interpreted by me - hgb normal.   Rec close GI f/u.  Return precautions provided.       Final diagnoses:  Rectal bleeding    ED Discharge Orders     None          Bernard Drivers, MD 02/04/24 1109

## 2024-02-04 NOTE — Discharge Instructions (Signed)
 It was our pleasure to provide your ER care today - we hope that you feel better.  Drink plenty of fluids/stay well hydrated. If constipated - get adequate fiber in diet. You may take colace (stool softener) 2x/day, and miralax (laxative) once per day as need - these medications are available over the counter.   For recent rectal bleeding, follow up with GI doctor in the next couple weeks - call office to arrange appointment.   Return to ER if worse, new symptoms, fevers, new or worsening or severe abdominal pain, persistent vomiting, heavy and/or rectal rectal bleeding, weak/fainting, or other concern.

## 2024-02-08 ENCOUNTER — Encounter: Payer: Self-pay | Admitting: Gastroenterology

## 2024-02-08 ENCOUNTER — Ambulatory Visit (INDEPENDENT_AMBULATORY_CARE_PROVIDER_SITE_OTHER): Admitting: Gastroenterology

## 2024-02-08 VITALS — BP 110/74 | HR 80 | Ht 74.0 in | Wt 228.1 lb

## 2024-02-08 DIAGNOSIS — K921 Melena: Secondary | ICD-10-CM

## 2024-02-08 DIAGNOSIS — R195 Other fecal abnormalities: Secondary | ICD-10-CM | POA: Diagnosis not present

## 2024-02-08 DIAGNOSIS — K625 Hemorrhage of anus and rectum: Secondary | ICD-10-CM

## 2024-02-08 MED ORDER — NA SULFATE-K SULFATE-MG SULF 17.5-3.13-1.6 GM/177ML PO SOLN: 1.0000 | Freq: Once | ORAL | 0 refills | Status: AC

## 2024-02-08 NOTE — Patient Instructions (Signed)
 You have been scheduled for a colonoscopy. Please follow written instructions given to you at your visit today.   If you use inhalers (even only as needed), please bring them with you on the day of your procedure.  DO NOT TAKE 7 DAYS PRIOR TO TEST- Trulicity (dulaglutide) Ozempic, Wegovy (semaglutide) Mounjaro (tirzepatide) Bydureon Bcise (exanatide extended release)  DO NOT TAKE 1 DAY PRIOR TO YOUR TEST Rybelsus (semaglutide) Adlyxin (lixisenatide) Victoza (liraglutide) Byetta (exanatide) ___________________________________________________________________________   _______________________________________________________  If your blood pressure at your visit was 140/90 or greater, please contact your primary care physician to follow up on this.  _______________________________________________________  If you are age 53 or older, your body mass index should be between 23-30. Your Body mass index is 29.29 kg/m. If this is out of the aforementioned range listed, please consider follow up with your Primary Care Provider.  If you are age 91 or younger, your body mass index should be between 19-25. Your Body mass index is 29.29 kg/m. If this is out of the aformentioned range listed, please consider follow up with your Primary Care Provider.   ________________________________________________________  The [facility] providers would like to encourage you to use MYCHART to communicate with providers for non-urgent requests or questions.  Due to long hold times on the telephone, sending your provider a message by [facility] may be a faster and more efficient way to get a response.  Please allow 48 business hours for a response.  Please remember that this is for non-urgent requests.  _______________________________________________________  [facility] is using a team-based approach to care.  Your team is made up of your doctor and two to three APPS. Our APPS (Nurse Practitioners and  Physician Assistants) work with your physician to ensure care continuity for you. They are fully qualified to address your health concerns and develop a treatment plan. They communicate directly with your gastroenterologist to care for you. Seeing the Advanced Practice Practitioners on your physician's team can help you by facilitating care more promptly, often allowing for earlier appointments, access to diagnostic testing, procedures, and other specialty referrals.     It was a pleasure to see you today!  Thank you for trusting me with your gastrointestinal care!     Scott E.Stacia, MD

## 2024-02-08 NOTE — Progress Notes (Signed)
 Discussed the use of AI scribe software for clinical note transcription with the patient, who gave verbal consent to proceed.  HPI : William Graves is a 48 year old male with no significant past medical history who presents with hematochezia.  He noticed blood in his stool starting last Sunday, initially associated with constipation and hard stools. A small amount of blood was observed on the toilet paper after wiping. The following day, he experienced a single episode of watery stool with a significant amount of bright red blood, prompting an emergency room visit. No bowel movements occurred on Monday and Tuesday, followed by a normal stool with traces of blood on Wednesday, and no bowel movements on Thursday and Friday.  In the ER, labs showed normal CBC, CMP, positive FOBT and a normal digital rectal exam, without mass lesion.  He was discharged home with recommendations for GI follow up.  His usual bowel habits are regular and normal, with occasional constipation or diarrhea. Skipping a few days of bowel movements is not entirely abnormal for him, but he currently feels bloated and gassy, which is atypical. No significant abdominal pain, but he describes the sensation more as gas and bloating.  He has never undergone a colonoscopy. There is no known family history of gastrointestinal conditions such as Crohn's disease or ulcerative colitis, and no known family history of cancers of the GI tract. His father had prostate cancer about five years ago, and his mother had breast cancer approximately twenty years ago.  No prior episodes of blood in the stool before this incident. No significant abdominal pain, nausea, vomiting, or fever. He mentions a sensation of his anus 'puckering' and feeling like it was protruding on Monday, which he describes as unusual.         Past Medical History:  Diagnosis Date   Urticaria      Past Surgical History:  Procedure Laterality Date   APPENDECTOMY      HAND SURGERY     Family History  Problem Relation Age of Onset   Allergic rhinitis Mother    Urticaria Mother    Asthma Sister    Eczema Sister    Social History   Tobacco Use   Smoking status: Former    Types: Cigarettes   Smokeless tobacco: Former  Building services engineer status: Every Day   Substances: Nicotine  Substance Use Topics   Alcohol use: No   Drug use: No   Current Outpatient Medications  Medication Sig Dispense Refill   cetirizine  (ZYRTEC  ALLERGY ) 10 MG tablet Take 1 tablet (10 mg total) by mouth 2 (two) times daily. 10 tablet 1   EPINEPHrine  (EPIPEN  2-PAK) 0.3 mg/0.3 mL IJ SOAJ injection Inject 0.3 mLs (0.3 mg total) into the muscle once as needed (for severe allergic reaction). CAll 911 immediately if you have to use this medicine 2 each 0   famotidine  (PEPCID ) 20 MG tablet Take 1 tablet (20 mg total) by mouth 2 (two) times daily for 5 days. 10 tablet 0   fluticasone  (FLONASE ) 50 MCG/ACT nasal spray Place 2 sprays into both nostrils daily as needed for allergies or rhinitis. 16 g 5   ibuprofen (ADVIL,MOTRIN) 200 MG tablet Take 400 mg by mouth every 6 (six) hours as needed.     levocetirizine (XYZAL ) 5 MG tablet Take 1 tablet (5 mg total) by mouth daily as needed for allergies. 30 tablet 5   Olopatadine  HCl (PATADAY ) 0.2 % SOLN Place 1 drop into both eyes  daily as needed. 2.5 mL 5   No current facility-administered medications for this visit.   Allergies  Allergen Reactions   Fish Allergy  Anaphylaxis   Shellfish Allergy  Anaphylaxis   Aspirin    Sulfa Antibiotics      Review of Systems: All systems reviewed and negative except where noted in HPI.    No results found.  Physical Exam: BP 110/74 (BP Location: Left Arm, Patient Position: Sitting, Cuff Size: Normal)   Pulse 80   Ht 6' 2 (1.88 m)   Wt 228 lb 2 oz (103.5 kg)   BMI 29.29 kg/m  Constitutional: Pleasant,well-developed, African American male in no acute distress. HEENT: Normocephalic and  atraumatic. Conjunctivae are normal. No scleral icterus. Neck supple.  Cardiovascular: Normal rate, regular rhythm.  Pulmonary/chest: Effort normal and breath sounds normal. No wheezing, rales or rhonchi. Abdominal: Soft, nondistended, nontender. Bowel sounds active throughout. There are no masses palpable. No hepatomegaly. Extremities: no edema Rectal: Deferred until time of colonoscopy Neurological: Alert and oriented to person place and time. Skin: Skin is warm and dry. No rashes noted. Psychiatric: Normal mood and affect. Behavior is normal.  CBC    Component Value Date/Time   WBC 6.1 02/04/2024 0953   RBC 4.44 02/04/2024 0953   HGB 13.9 02/04/2024 0953   HCT 43.2 02/04/2024 0953   PLT 197 02/04/2024 0953   MCV 97.3 02/04/2024 0953   MCH 31.3 02/04/2024 0953   MCHC 32.2 02/04/2024 0953   RDW 10.6 (L) 02/04/2024 0953    CMP     Component Value Date/Time   NA 142 02/04/2024 0953   K 4.0 02/04/2024 0953   CL 108 02/04/2024 0953   CO2 26 02/04/2024 0953   GLUCOSE 109 (H) 02/04/2024 0953   BUN 10 02/04/2024 0953   CREATININE 1.00 02/04/2024 0953   CALCIUM 9.0 02/04/2024 0953   PROT 7.3 02/04/2024 0953   ALBUMIN 4.0 02/04/2024 0953   AST 17 02/04/2024 0953   ALT 11 02/04/2024 0953   ALKPHOS 53 02/04/2024 0953   BILITOT 1.2 02/04/2024 0953   GFRNONAA >60 02/04/2024 0953       Latest Ref Rng & Units 02/04/2024    9:53 AM 01/18/2009    7:03 PM  CBC EXTENDED  WBC 4.0 - 10.5 K/uL 6.1  9.4   RBC 4.22 - 5.81 MIL/uL 4.44  3.88   Hemoglobin 13.0 - 17.0 g/dL 86.0  86.6   HCT 60.9 - 52.0 % 43.2  39.1   Platelets 150 - 400 K/uL 197  182       ASSESSMENT AND PLAN:  48 year old male with recent episode of painless rectal bleeding.  Most likely hemorrhoidal, but colonoscopy indicated to exclude other etiologies.  Rectal bleeding/positive FOBT Acute bright red rectal bleeding likely due to internal hemorrhoids. Normal hemoglobin levels suggest no significant blood loss.  Differential includes diverticular bleeding, less likely polyps or masses. - Schedule colonoscopy to evaluate for internal hemorrhoids, diverticular bleeding, polyps, or masses. - Discuss potential findings and outcomes of colonoscopy, including removal of polyps if found. - Educate on the colonoscopy procedure, including bowel prep and sedation with propofol.  The details, risks (including bleeding, perforation, infection, missed lesions, medication reactions and possible hospitalization or surgery if complications occur), benefits, and alternatives to colonoscopy with possible biopsy and possible polypectomy were discussed with the patient and he consents to proceed.    Recording duration: 20 minutes     Riyana Biel E. Stacia, MD [facility]    No ref.  provider found

## 2024-02-18 ENCOUNTER — Ambulatory Visit (AMBULATORY_SURGERY_CENTER): Admitting: Gastroenterology

## 2024-02-18 ENCOUNTER — Encounter: Payer: Self-pay | Admitting: Gastroenterology

## 2024-02-18 VITALS — BP 108/69 | HR 64 | Temp 97.8°F | Resp 13 | Ht 74.0 in | Wt 228.0 lb

## 2024-02-18 DIAGNOSIS — K644 Residual hemorrhoidal skin tags: Secondary | ICD-10-CM

## 2024-02-18 DIAGNOSIS — K5730 Diverticulosis of large intestine without perforation or abscess without bleeding: Secondary | ICD-10-CM

## 2024-02-18 DIAGNOSIS — K641 Second degree hemorrhoids: Secondary | ICD-10-CM | POA: Diagnosis not present

## 2024-02-18 DIAGNOSIS — D125 Benign neoplasm of sigmoid colon: Secondary | ICD-10-CM

## 2024-02-18 DIAGNOSIS — K921 Melena: Secondary | ICD-10-CM | POA: Diagnosis not present

## 2024-02-18 MED ORDER — SODIUM CHLORIDE 0.9 % IV SOLN
500.0000 mL | INTRAVENOUS | Status: DC
Start: 2024-02-18 — End: 2024-02-18

## 2024-02-18 NOTE — Progress Notes (Signed)
 Pt's states no medical or surgical changes since previsit or office visit.

## 2024-02-18 NOTE — Patient Instructions (Addendum)
 Resume previous diet. Continue present medications. Awaiting pathology results. Repeat colonoscopy (date not yet determined) for surveillance.  Recommend daily fiber supplement to reduce hemorrhoidal bleeding and risks of diverticular complications.  Handouts provided on polyps, diverticulosis, hemorrhoids, and hemorrhoidal banding.   YOU HAD AN ENDOSCOPIC PROCEDURE TODAY AT THE [facility]:   Refer to the procedure report that was given to you for any specific questions about what was found during the examination.  If the procedure report does not answer your questions, please call your gastroenterologist to clarify.  If you requested that your care partner not be given the details of your procedure findings, then the procedure report has been included in a sealed envelope for you to review at your convenience later.  YOU SHOULD EXPECT: Some feelings of bloating in the abdomen. Passage of more gas than usual.  Walking can help get rid of the air that was put into your GI tract during the procedure and reduce the bloating. If you had a lower endoscopy (such as a colonoscopy or flexible sigmoidoscopy) you may notice spotting of blood in your stool or on the toilet paper. If you underwent a bowel prep for your procedure, you may not have a normal bowel movement for a few days.  Please Note:  You might notice some irritation and congestion in your nose or some drainage.  This is from the oxygen used during your procedure.  There is no need for concern and it should clear up in a day or so.  SYMPTOMS TO REPORT IMMEDIATELY:  Following lower endoscopy (colonoscopy or flexible sigmoidoscopy):  Excessive amounts of blood in the stool  Significant tenderness or worsening of abdominal pains  Swelling of the abdomen that is new, acute  Fever of 100F or higher  For urgent or emergent issues, a gastroenterologist can be reached at any hour by calling [phone]. Do not use MyChart  messaging for urgent concerns.    DIET:  We do recommend a small meal at first, but then you may proceed to your regular diet.  Drink plenty of fluids but you should avoid alcoholic beverages for 24 hours.  ACTIVITY:  You should plan to take it easy for the rest of today and you should NOT DRIVE or use heavy machinery until tomorrow (because of the sedation medicines used during the test).    FOLLOW UP: Our staff will call the number listed on your records the next business day following your procedure.  We will call around 7:15- 8:00 am to check on you and address any questions or concerns that you may have regarding the information given to you following your procedure. If we do not reach you, we will leave a message.     If any biopsies were taken you will be contacted by phone or by letter within the next 1-3 weeks.  Please call us  at [phone] if you have not heard about the biopsies in 3 weeks.    SIGNATURES/CONFIDENTIALITY: You and/or your care partner have signed paperwork which will be entered into your electronic medical record.  These signatures attest to the fact that that the information above on your After Visit Summary has been reviewed and is understood.  Full responsibility of the confidentiality of this discharge information lies with you and/or your care-partner.

## 2024-02-18 NOTE — Progress Notes (Signed)
 Called to room to assist during endoscopic procedure.  Patient ID and intended procedure confirmed with present staff. Received instructions for my participation in the procedure from the performing physician.

## 2024-02-18 NOTE — Op Note (Signed)
 [facility] Patient Name: William Graves Procedure Date: 02/18/2024 3:24 PM MRN: [id number] Endoscopist: Glendia E. Stacia , MD, [id number] Age: 48 Referring MD:  Date of Birth: [birth year] Gender: Male Account #: [id number] Procedure:                Colonoscopy Indications:              Hematochezia, Heme positive stool Medicines:                Monitored Anesthesia Care Procedure:                Pre-Anesthesia Assessment:                           - Prior to the procedure, a History and Physical                            was performed, and patient medications and                            allergies were reviewed. The patient's tolerance of                            previous anesthesia was also reviewed. The risks                            and benefits of the procedure and the sedation                            options and risks were discussed with the patient.                            All questions were answered, and informed consent                            was obtained. Prior Anticoagulants: The patient has                            taken no anticoagulant or antiplatelet agents. ASA                            Grade Assessment: II - A patient with mild systemic                            disease. After reviewing the risks and benefits,                            the patient was deemed in satisfactory condition to                            undergo the procedure.                           After obtaining informed consent, the colonoscope  was passed under direct vision. Throughout the                            procedure, the patient's blood pressure, pulse, and                            oxygen saturations were monitored continuously. The                            CF [id number] #[id number] was introduced through the anus                            and advanced to the the cecum, identified by                            appendiceal orifice and  ileocecal valve. The                            colonoscopy was performed without difficulty. The                            patient tolerated the procedure well. The quality                            of the bowel preparation was excellent. The                            ileocecal valve, appendiceal orifice, and rectum                            were photographed. The bowel preparation used was                            SUPREP via split dose instruction. Scope In: 3:30:51 PM Scope Out: 3:42:13 PM Scope Withdrawal Time: 0 hours 7 minutes 9 seconds  Total Procedure Duration: 0 hours 11 minutes 22 seconds  Findings:                 Hemorrhoids were found on perianal exam.                           The digital rectal exam was normal. Pertinent                            negatives include normal sphincter tone and no                            palpable rectal lesions.                           A 7 mm polyp was found in the sigmoid colon. The                            polyp was sessile. The polyp was removed with a  cold snare. Resection and retrieval were complete.                            Estimated blood loss was minimal.                           Multiple medium-mouthed and small-mouthed                            diverticula were found in the sigmoid colon and                            descending colon.                           The exam was otherwise normal throughout the                            examined colon.                           Non-bleeding internal hemorrhoids were found during                            retroflexion. The hemorrhoids were medium-sized and                            Grade II (internal hemorrhoids that prolapse but                            reduce spontaneously).                           No additional abnormalities were found on                            retroflexion. Complications:            No immediate  complications. Estimated Blood Loss:     Estimated blood loss was minimal. Impression:               - Hemorrhoids found on perianal exam.                           - One 7 mm polyp in the sigmoid colon, removed with                            a cold snare. Resected and retrieved.                           - Mild diverticulosis in the sigmoid colon and in                            the descending colon.                           - Non-bleeding internal hemorrhoids. This is the  likely source of the patient's hematochezia. Recommendation:           - Patient has a contact number available for                            emergencies. The signs and symptoms of potential                            delayed complications were discussed with the                            patient. Return to normal activities tomorrow.                            Written discharge instructions were provided to the                            patient.                           - Resume previous diet.                           - Continue present medications.                           - Await pathology results.                           - Repeat colonoscopy (date not yet determined) for                            surveillance.                           - Recommend daily fiber supplement to reduce                            hemorrhoidal bleeding and risks of diverticular                            complications. Kynadie Yaun E. Stacia, MD 02/18/2024 3:48:20 PM This report has been signed electronically.

## 2024-02-18 NOTE — Progress Notes (Signed)
 Report to PACU, RN, vss, BBS= Clear.

## 2024-02-18 NOTE — Progress Notes (Signed)
 History and Physical Interval Note:  02/18/2024 3:24 PM  William Graves  has presented today for endoscopic procedure(s), with the diagnosis of  Encounter Diagnosis  Name Primary?   Hematochezia Yes  .  The various methods of evaluation and treatment have been discussed with the patient and/or family. After consideration of risks, benefits and other options for treatment, the patient has consented to  the endoscopic procedure(s).   The patient's history has been reviewed, patient examined, no change in status, stable for endoscopic procedure(s).  I have reviewed the patient's chart and labs.  Questions were answered to the patient's satisfaction.     Marigrace Mccole E. Stacia, MD [facility]

## 2024-02-19 ENCOUNTER — Telehealth: Payer: Self-pay

## 2024-02-19 NOTE — Telephone Encounter (Signed)
 No answer, left message to call if having any issues or concerns, B.Vester Titsworth RN

## 2024-02-21 LAB — SURGICAL PATHOLOGY

## 2024-02-25 ENCOUNTER — Ambulatory Visit: Payer: Self-pay | Admitting: Gastroenterology

## 2024-02-25 NOTE — Progress Notes (Signed)
 William Graves,  The polyp which I removed during your recent procedure was proven to be completely benign but is considered a pre-cancerous polyp that MAY have grown into cancer if it had not been removed.  Studies shows that at least 20% of women over age 47 and 30% of men over age 49 have pre-cancerous polyps.  Based on current nationally recognized surveillance guidelines, I recommend that you have a repeat colonoscopy in 7 years.   If you develop any new rectal bleeding, abdominal pain or significant bowel habit changes, please contact me before then.

## 2024-08-18 ENCOUNTER — Ambulatory Visit: Admitting: Podiatry
